# Patient Record
Sex: Female | Born: 1970 | Race: White | Hispanic: No | Marital: Married | Smoking: Never smoker
Health system: Southern US, Community
[De-identification: ages and names within clinical notes are randomized; demographics above are authoritative.]

## PROBLEM LIST (undated history)

## (undated) HISTORY — PX: TONSILLECTOMY: SUR1361

---

## 2009-06-12 HISTORY — PX: REDUCTION MAMMAPLASTY: SUR839

## 2017-12-06 ENCOUNTER — Other Ambulatory Visit: Payer: Self-pay | Admitting: Family Medicine

## 2017-12-06 DIAGNOSIS — Z1231 Encounter for screening mammogram for malignant neoplasm of breast: Secondary | ICD-10-CM

## 2018-01-09 ENCOUNTER — Other Ambulatory Visit: Payer: Self-pay | Admitting: Family Medicine

## 2018-01-09 DIAGNOSIS — Z1231 Encounter for screening mammogram for malignant neoplasm of breast: Secondary | ICD-10-CM

## 2018-01-25 ENCOUNTER — Ambulatory Visit
Admission: RE | Admit: 2018-01-25 | Discharge: 2018-01-25 | Disposition: A | Discharge status: Home / Self Care | Payer: BC Managed Care – PPO | Source: Ambulatory Visit | Attending: Family Medicine | Admitting: Family Medicine

## 2018-01-25 DIAGNOSIS — Z1231 Encounter for screening mammogram for malignant neoplasm of breast: Secondary | ICD-10-CM | POA: Diagnosis present

## 2018-02-04 ENCOUNTER — Other Ambulatory Visit: Payer: Self-pay | Admitting: *Deleted

## 2018-02-04 ENCOUNTER — Inpatient Hospital Stay
Admission: RE | Admit: 2018-02-04 | Discharge: 2018-02-04 | Disposition: A | Discharge status: Home / Self Care | Payer: Self-pay | Source: Ambulatory Visit | Attending: *Deleted | Admitting: *Deleted

## 2018-02-04 DIAGNOSIS — Z9289 Personal history of other medical treatment: Secondary | ICD-10-CM

## 2018-09-04 ENCOUNTER — Ambulatory Visit: Payer: BC Managed Care – PPO | Admitting: Allergy & Immunology

## 2018-09-04 ENCOUNTER — Encounter: Payer: Self-pay | Admitting: Allergy & Immunology

## 2018-09-04 ENCOUNTER — Other Ambulatory Visit: Payer: Self-pay

## 2018-09-04 VITALS — BP 130/80 | HR 78 | Temp 99.1°F | Ht 60.59 in | Wt 188.0 lb

## 2018-09-04 DIAGNOSIS — J310 Chronic rhinitis: Secondary | ICD-10-CM | POA: Diagnosis not present

## 2018-09-04 DIAGNOSIS — L508 Other urticaria: Secondary | ICD-10-CM | POA: Diagnosis not present

## 2018-09-04 NOTE — Patient Instructions (Addendum)
1. Chronic rhinitis - Testing today showed: red cedar (which is the only tree that blooms in the winter) and cat - Copy of test results provided.  - We are going to confirm this with blood testing as well.  - Avoidance measures provided. - Continue with: Zyrtec (cetirizine) 37m tablet twice daily - Start taking: Nasacort (triamcinolone) one spray per nostril daily as needed. - You can use an extra dose of the antihistamine, if needed, for breakthrough symptoms.  - Consider nasal saline rinses 1-2 times daily to remove allergens from the nasal cavities as well as help with mucous clearance (this is especially helpful to do before the nasal sprays are given)  2. Chronic urticaria - Your history does not have any "red flags" such as fevers, joint pains, or permanent skin changes that would be concerning for a more serious cause of hives.  - We will get some labs to rule out serious causes of hives: complete blood count, tryptase level, chronic urticaria panel, alpha gal panel, CMP, ESR, and CRP. - Chronic hives are often times a self limited process and will "burn themselves out" over 6-12 months, although this is not always the case.  - In the meantime, start suppressive dosing of antihistamines:   - Morning: Zyrtec (cetirizine) 10-2110m(one to two tablets) + Pepcid (famotidine) 2076m - Evening: Zyrtec (cetirizine) 10-86m76mne to two tablets) + Pepcid (famotidine) 86mg74m If the above is not working, try adding: Pepcid (famotidine) 86mg 67mou can change this dosing at home, decreasing the dose as needed or increasing the dosing as needed.   - If you are not tolerating the medications or are tired of taking them every day, we can start treatment with a monthly injectable medication called Xolair.   3. Return in about 6 weeks (around 10/16/2018). E-visit could work!    Please inform us of [facility] visits, hospitalizations, or changes in symptoms. Call us befKoreae going to the ED  for breathing or allergy symptoms since we might be able to fit you in for a sick visit. Feel free to contact us anyKoreame with any questions, problems, or concerns.  It was a pleasure to meet you today!  Websites that have reliable patient information: 1. American Academy of Asthma, Allergy, and Immunology: www.aaaai.org 2. Food Allergy Research and Education (FARE): foodallergy.org 3. Mothers of Asthmatics: http://www.asthmacommunitynetwork.org 4. American College of Allergy, Asthma, and Immunology: www.acaai.org  "Like" us on [place] and Instagram for our latest updates!      Make sure you are registered to vote! If you have moved or changed any of your contact information, you will need to get this updated before voting!    Voter ID laws are NOT going into effect for the General Election in November 2020! DO NOT let this stop you from exercising your right to vote!       Reducing Pollen Exposure  The American Academy of Allergy, Asthma and Immunology suggests the following steps to reduce your exposure to pollen during allergy seasons.    1. Do not hang sheets or clothing out to dry; pollen may collect on these items. 2. Do not mow lawns or spend time around freshly cut grass; mowing stirs up pollen. 3. Keep windows closed at night.  Keep car windows closed while driving. 4. Minimize morning activities outdoors, a time when pollen counts are usually at their highest. 5. Stay indoors as much as possible when pollen counts or humidity is  high and on windy days when pollen tends to remain in the air longer. 6. Use air conditioning when possible.  Many air conditioners have filters that trap the pollen spores. 7. Use a HEPA room air filter to remove pollen form the indoor air you breathe.  Control of Dog or Cat Allergen  Avoidance is the best way to manage a dog or cat allergy. If you have a dog or cat and are allergic to dog or cats, consider removing the dog or cat from the  home. If you have a dog or cat but don't want to find it a new home, or if your family wants a pet even though someone in the household is allergic, here are some strategies that may help keep symptoms at bay:  1. Keep the pet out of your bedroom and restrict it to only a few rooms. Be advised that keeping the dog or cat in only one room will not limit the allergens to that room. 2. Don't pet, hug or kiss the dog or cat; if you do, wash your hands with soap and water. 3. High-efficiency particulate air (HEPA) cleaners run continuously in a bedroom or living room can reduce allergen levels over time. 4. Regular use of a high-efficiency vacuum cleaner or a central vacuum can reduce allergen levels. 5. Giving your dog or cat a bath at least once a week can reduce airborne allergen.

## 2018-09-04 NOTE — Progress Notes (Signed)
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  Symptoms have been ongoing for approximately 2 months at this point.  She has no concerning symptoms aside from the urticaria, such as fever or joint pain, but we will do a somewhat extensive lab work-up just to rule out more serious causes.  Testing today did reveal cat as well as red cedar.  She denies any cat exposure, although there are studies that show that this is ubiquitous in everybody's environment.  However, interestingly, red cedar was starting to bloom around the same time that her hives started.  Therefore, this certainly could have been the trigger.  Plan/Recommendations:   1. Chronic rhinitis - Testing today showed: red cedar (which is the only tree that blooms in the winter) and cat - Copy of test results provided.  - We are going to confirm this with blood testing as well.  - Avoidance measures provided. - Continue with: Zyrtec (cetirizine) 48m tablet twice daily - Start taking: Nasacort (triamcinolone) one spray per nostril daily as needed. - You can use an extra dose of the antihistamine, if needed, for breakthrough symptoms.  - Consider nasal saline rinses 1-2 times daily to remove allergens from the nasal cavities as well as help with mucous clearance (this is especially helpful to do before the nasal sprays are given)  2. Chronic urticaria - Your history does not have any "red flags" such as fevers, joint pains, or permanent skin changes that would be concerning for a more serious cause of hives.  - We will get some labs to rule out serious causes of hives: complete blood count, tryptase level, chronic urticaria panel, alpha gal panel, CMP, ESR, and CRP. - Chronic hives are often times a self limited process  and will "burn themselves out" over 6-12 months, although this is not always the case.  - In the meantime, start suppressive dosing of antihistamines:   - Morning: Zyrtec (cetirizine) 10-29m(one to two tablets) + Pepcid (famotidine) 2079m - Evening: Zyrtec (cetirizine) 10-98m86mne to two tablets) + Pepcid (famotidine) 98mg81m If the above is not working, try adding: Pepcid (famotidine) 98mg 54mou can change this dosing at home, decreasing the dose as needed or increasing the dosing as needed.   - If you are not tolerating the medications or are tired of taking them every day, we can start treatment with a monthly injectable medication called Xolair.   3. Return in about 6 weeks (around 10/16/2018). E-visit could work!   Subjective:   Jacqueline Steinhardt47 y.o1female presenting today for evaluation of  Chief Complaint  Patient presents with  . Rash    shoulder and chin    Jacqueline Porter history of the following: There are no active problems to display for this patient.   History obtained from: chart review and patient.  Jacqueline CoileSharla Kidneyeferred by HedricMaryland Pink    SharonMikesha47 y.o19female presenting for an evaluation of urticaria. These have been ongoing for a period of two months.  These hives started on [employer].  They first started on her shoulder and then spread to her face.  She went to urgent care a  total of 3 separate occasions for treatment.  In the urgent care, she was given cetirizine and famotidine plus or minus prednisone.  The prednisone seemed to clear things up pretty quickly.  She never had any anaphylactic reactions with these.  They were extremely pruritic.  They did not leave any residual scarring, aside from the scarring induced from the scratching.  She had no fevers with these or viral URI symptoms.  As long as she takes cetirizine and famotidine twice daily, she does well without  any breakthrough symptoms.  She does not have an EpiPen.  She does not think that it correlates with any particular food.  She tolerates all the major food allergens without adverse event.  She does eat red meat several times a week.  She denies any recent tick bites.  She does have a history of seasonal allergies.  Spring seems to be the worst time of the year.  She will just use an over-the-counter antihistamine as needed.  She does have dogs at home, which are not new to her.  She did receive an inhaler during an episode of bronchitis several years ago.  Otherwise, she has never needed an inhaler.  She denies any nighttime coughing or wheezing.  Otherwise, there is no history of other atopic diseases, including asthma, drug allergies, stinging insect allergies, eczema or contact dermatitis. There is no significant infectious history. Vaccinations are up to date.    Past Medical History: There are no active problems to display for this patient.   Medication List:  Allergies as of 09/04/2018   Not on File     Medication List       Accurate as of September 04, 2018 12:16 PM. Always use your most recent med list.        amphetamine-dextroamphetamine 20 MG tablet Commonly known as:  ADDERALL Take 20 mg by mouth daily.   APPLE CIDER VINEGAR PO Take by mouth.   cetirizine 10 MG tablet Commonly known as:  ZYRTEC Take 10 mg by mouth daily.   citalopram 10 MG tablet Commonly known as:  CELEXA Take 10 mg by mouth daily.   famotidine 20 MG tablet Commonly known as:  PEPCID Take 20 mg by mouth 2 (two) times daily.       Birth History: non-contributory  Developmental History: non-contributory  Past Surgical History: Past Surgical History:  Procedure Laterality Date  . REDUCTION MAMMAPLASTY Bilateral 2011  . TONSILLECTOMY       Family History: Family History  Problem Relation Age of Onset  . Allergic rhinitis Neg Hx   . Asthma Neg Hx   . Eczema Neg Hx   . Urticaria  Neg Hx      Social History: Jacqueline Porter lives at home with her husband and daughter.  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  They have 2 dogs in the home.  There is no smoking exposure.  She does not have dust mite covers on her bedding.  She is from the [place] area, but moved up here when she married her current husband.   Review of Systems  Constitutional: Negative.  Negative for chills, fever, malaise/fatigue and weight loss.  HENT: Negative.  Negative for congestion, ear discharge, ear pain and sore throat.   Eyes: Negative for pain, discharge and redness.  Respiratory: Negative for cough, sputum production, shortness of breath and wheezing.   Cardiovascular: Negative.  Negative for chest pain and palpitations.  Gastrointestinal: Negative for abdominal pain, heartburn, nausea and  vomiting.  Skin: Positive for itching and rash.  Neurological: Negative for dizziness and headaches.  Endo/Heme/Allergies: Positive for environmental allergies. Does not bruise/bleed easily.       Objective:   Blood pressure 130/80, pulse 78, temperature 99.1 F (37.3 C), temperature source Oral, height 5' 0.59" (1.539 m), weight 188 lb (85.3 kg), SpO2 98 %. Body mass index is 36 kg/m.   Physical Exam:   Physical Exam  Constitutional: She appears well-developed and well-nourished.  Pleasant female.  HENT:  Head: Normocephalic and atraumatic.  Right Ear: Tympanic membrane, external ear and ear canal normal. No drainage, swelling or tenderness. Tympanic membrane is not injected, not scarred, not erythematous, not retracted and not bulging.  Left Ear: Tympanic membrane, external ear and ear canal normal. No drainage, swelling or tenderness. Tympanic membrane is not injected, not scarred, not erythematous, not retracted and not bulging.  Nose: Mucosal edema present. No rhinorrhea, nasal deformity or septal deviation. No epistaxis. Right sinus exhibits no maxillary sinus tenderness and no  frontal sinus tenderness. Left sinus exhibits no maxillary sinus tenderness and no frontal sinus tenderness.  Mouth/Throat: Uvula is midline and oropharynx is clear and moist. Mucous membranes are not pale and not dry.  No cobblestoning present in the posterior oropharynx.  Eyes: Pupils are equal, round, and reactive to light. Conjunctivae and EOM are normal. Right eye exhibits no chemosis and no discharge. Left eye exhibits no chemosis and no discharge. Right conjunctiva is not injected. Left conjunctiva is not injected.  Cardiovascular: Normal rate, regular rhythm and normal heart sounds.  Respiratory: Effort normal and breath sounds normal. No accessory muscle usage. No tachypnea. No respiratory distress. She has no wheezes. She has no rhonchi. She has no rales. She exhibits no tenderness.  Moving air well in all lung fields.  No crackles or wheezes.  GI: Bowel sounds are normal. There is no abdominal tenderness. There is no rebound and no guarding.  Lymphadenopathy:       Head (right side): No submandibular, no tonsillar and no occipital adenopathy present.       Head (left side): No submandibular, no tonsillar and no occipital adenopathy present.    She has no cervical adenopathy.  Neurological: She is alert.  Skin: No abrasion, no petechiae and no rash noted. Rash is not papular, not vesicular and not urticarial. No erythema. No pallor.  She does have some residual scarring on the right arm secondary to where she scratched.  Otherwise, there are no urticarial lesions appreciated.  There are no eczematous lesions appreciated.  Psychiatric: She has a normal mood and affect.     Diagnostic studies:   Allergy Studies:    Airborne Adult Perc - 09/04/18 1022    Time Antigen Placed  1022    Allergen Manufacturer  Greer    Location  Back    Number of Test  59    Panel 1  Select    1. Control-Buffer 50% Glycerol  Negative    2. Control-Histamine 1 mg/ml  2+    3. Albumin saline  Negative     4. [place]  Negative    5. [place]  Negative    6. Johnson  Negative    7. Lima Blue  Negative    8. Meadow Fescue  Negative    9. Perennial Rye  Negative    10. Sweet Vernal  Negative    11. Timothy  Negative    12. Cocklebur  Negative    13. Burweed  Marshelder  Negative    14. Ragweed, short  Negative    15. Ragweed, Giant  Negative    16. Plantain,  English  Negative    17. Lamb's Quarters  Negative    18. Sheep Sorrell  Negative    19. Rough Pigweed  Negative    20. Marsh Elder, Rough  Negative    21. Mugwort, Common  Negative    22. Ash mix  Negative    23. Birch mix  Negative    24. Beech American  Negative    25. Box, Elder  Negative    26. Cedar, red  2+    27. Cottonwood, [place]  Negative    28. Elm mix  Negative    29. Hickory mix  Negative    30. Maple mix  Negative    31. Oak, [place] mix  Negative    32. Pecan Pollen  Negative    33. Pine mix  Negative    34. Sycamore Eastern  Negative    35. Ple[place], Black Pollen  Negative    36. Alternaria alternata  Negative    37. Cladosporium Herbarum  Negative    38. Aspergillus mix  Negative    39. Penicillium mix  Negative    40. Bipolaris sorokiniana (Helminthosporium)  Negative    41. Drechslera spicifera (Curvularia)  Negative    42. Mucor plumbeus  Negative    43. Fusarium moniliforme  Negative    44. Aureobasidium pullulans (pullulara)  Negative    45. Rhizopus oryzae  Negative    46. Botrytis cinera  Negative    47. Epicoccum nigrum  Negative    48. Phoma betae  Negative    49. Candida Albicans  Negative    50. Trichophyton mentagrophytes  Negative    51. Mite, D Farinae  5,000 AU/ml  Negative    52. Mite, D Pteronyssinus  5,000 AU/ml  Negative    53. Cat Hair 10,000 BAU/ml  3+    54.  Dog Epithelia  Negative    55. Mixed Feathers  Negative    56. Horse Epithelia  Negative    57. Cockroach, German  Negative    58. Mouse  Negative    59. Tobacco Leaf  Negative     Food Adult Perc - 09/04/18 1000     Time Antigen Placed  1022    Allergen Manufacturer  Greer    Location  Back    Number of allergen test  10    1. Peanut  Negative    2. Soybean  Negative    3. Wheat  Negative    4. Sesame  Negative    5. Milk, cow  Negative    6. Egg White, Chicken  Negative    7. Casein  Negative    8. Shellfish Mix  Negative    9. Fish Mix  Negative    10. Cashew  Negative       Allergy testing results were read and interpreted by myself, documented by clinical staff.         Salvatore Marvel, MD [facility]

## 2018-09-10 LAB — CMP14+EGFR
ALT: 16 IU/L (ref 0–32)
AST: 24 IU/L (ref 0–40)
Albumin/Globulin Ratio: 1.9 (ref 1.2–2.2)
Albumin: 4.3 g/dL (ref 3.8–4.8)
Alkaline Phosphatase: 103 IU/L (ref 39–117)
BUN/Creatinine Ratio: 10 (ref 9–23)
BUN: 9 mg/dL (ref 6–24)
Bilirubin Total: 0.8 mg/dL (ref 0.0–1.2)
CO2: 25 mmol/L (ref 20–29)
Calcium: 8.8 mg/dL (ref 8.7–10.2)
Chloride: 99 mmol/L (ref 96–106)
Creatinine, Ser: 0.93 mg/dL (ref 0.57–1.00)
GFR calc Af Amer: 84 mL/min/{1.73_m2} (ref >59)
GFR calc non Af Amer: 73 mL/min/{1.73_m2} (ref >59)
Globulin, Total: 2.3 g/dL (ref 1.5–4.5)
Glucose: 82 mg/dL (ref 65–99)
Potassium: 4.3 mmol/L (ref 3.5–5.2)
Sodium: 141 mmol/L (ref 134–144)
Total Protein: 6.6 g/dL (ref 6.0–8.5)

## 2018-09-10 LAB — IGE+ALLERGENS ZONE 2(30)
Alternaria Alternata IgE: <0.1 kU/L
Amer Sycamore IgE Qn: <0.1 kU/L
Aspergillus Fumigatus IgE: <0.1 kU/L
Bahia Grass IgE: <0.1 kU/L
Bermuda Grass IgE: <0.1 kU/L
Cat Dander IgE: 1.34 kU/L — AB
Cedar, Mountain IgE: 2.04 kU/L — AB
Cladosporium Herbarum IgE: <0.1 kU/L
Cockroach, American IgE: <0.1 kU/L
Common Silver Birch IgE: <0.1 kU/L
D Farinae IgE: 0.13 kU/L — AB
D Pteronyssinus IgE: <0.1 kU/L
Dog Dander IgE: 0.13 kU/L — AB
Elm, American IgE: <0.1 kU/L
Hickory, White IgE: <0.1 kU/L
IgE (Immunoglobulin E), Serum: 33 IU/mL (ref 6–495)
Johnson Grass IgE: <0.1 kU/L
Maple/Box Elder IgE: <0.1 kU/L
Mucor Racemosus IgE: <0.1 kU/L
Mugwort IgE Qn: <0.1 kU/L
Nettle IgE: <0.1 kU/L
Oak, White IgE: <0.1 kU/L
Penicillium Chrysogen IgE: <0.1 kU/L
Pigweed, Rough IgE: <0.1 kU/L
Plantain, English IgE: <0.1 kU/L
Ragweed, Short IgE: <0.1 kU/L
Sheep Sorrel IgE Qn: <0.1 kU/L
Stemphylium Herbarum IgE: <0.1 kU/L
Sweet gum IgE RAST Ql: <0.1 kU/L
Timothy Grass IgE: <0.1 kU/L
White Mulberry IgE: <0.1 kU/L

## 2018-09-10 LAB — CBC WITH DIFFERENTIAL/PLATELET
Basophils Absolute: 0.1 10*3/uL (ref 0.0–0.2)
Basos: 1 %
EOS (ABSOLUTE): 0.6 10*3/uL — ABNORMAL HIGH (ref 0.0–0.4)
Eos: 8 %
Hematocrit: 39.3 % (ref 34.0–46.6)
Hemoglobin: 13.6 g/dL (ref 11.1–15.9)
Immature Grans (Abs): 0 10*3/uL (ref 0.0–0.1)
Immature Granulocytes: 0 %
Lymphocytes Absolute: 1.5 10*3/uL (ref 0.7–3.1)
Lymphs: 21 %
MCH: 30.7 pg (ref 26.6–33.0)
MCHC: 34.6 g/dL (ref 31.5–35.7)
MCV: 89 fL (ref 79–97)
Monocytes Absolute: 0.4 10*3/uL (ref 0.1–0.9)
Monocytes: 6 %
Neutrophils Absolute: 4.4 10*3/uL (ref 1.4–7.0)
Neutrophils: 64 %
Platelets: 344 10*3/uL (ref 150–450)
RBC: 4.43 x10E6/uL (ref 3.77–5.28)
RDW: 13.1 % (ref 11.7–15.4)
WBC: 7 10*3/uL (ref 3.4–10.8)

## 2018-09-10 LAB — ANA W/REFLEX IF POSITIVE: Anti Nuclear Antibody (ANA): NEGATIVE

## 2018-09-10 LAB — SEDIMENTATION RATE: Sed Rate: 23 mm/hr (ref 0–32)

## 2018-09-10 LAB — ALPHA-GAL PANEL
Alpha Gal IgE*: <0.1 kU/L (ref <0.10)
Beef (Bos spp) IgE: <0.1 kU/L (ref <0.35)
Class Interpretation: 0
Class Interpretation: 0
Class Interpretation: 0
Lamb/Mutton (Ovis spp) IgE: <0.1 kU/L (ref <0.35)
Pork (Sus spp) IgE: <0.1 kU/L (ref <0.35)

## 2018-09-10 LAB — TRYPTASE: Tryptase: 3.4 ug/L (ref 2.2–13.2)

## 2018-09-10 LAB — C-REACTIVE PROTEIN: CRP: 12 mg/L — ABNORMAL HIGH (ref 0–10)

## 2018-09-10 LAB — CHRONIC URTICARIA: cu index: 4.2 (ref <10)

## 2018-10-17 ENCOUNTER — Encounter: Payer: Self-pay | Admitting: Allergy & Immunology

## 2018-10-17 ENCOUNTER — Ambulatory Visit (INDEPENDENT_AMBULATORY_CARE_PROVIDER_SITE_OTHER): Payer: BC Managed Care – PPO | Admitting: Allergy & Immunology

## 2018-10-17 ENCOUNTER — Other Ambulatory Visit: Payer: Self-pay

## 2018-10-17 DIAGNOSIS — J3089 Other allergic rhinitis: Secondary | ICD-10-CM

## 2018-10-17 DIAGNOSIS — J302 Other seasonal allergic rhinitis: Secondary | ICD-10-CM

## 2018-10-17 DIAGNOSIS — L508 Other urticaria: Secondary | ICD-10-CM | POA: Diagnosis not present

## 2018-10-17 MED ORDER — TRIAMCINOLONE ACETONIDE 55 MCG/ACT NA AERO: 2.0000 | INHALATION_SPRAY | Freq: Every day | NASAL | 5 refills | Status: AC

## 2018-10-17 NOTE — Progress Notes (Signed)
RE: Lamount CrankerSharon Michelle Locklear Porter MRN: [id number] DOB: 1970-09-07 Date of Telemedicine Visit: 10/17/2018  Referring provider: Jerl MinaHedrick, James, MD Primary care provider: Jerl MinaHedrick, James, MD  Chief Complaint: Follow-up   Telemedicine Follow Up Visit via WebEx: I connected with Jacqueline Porter for a follow up on 10/17/18 by WebEx and verified that I am speaking with the correct person using two identifiers.   I discussed the limitations, risks, security and privacy concerns of performing an evaluation and management service by telemedicine and the availability of in person appointments. I also discussed with the patient that there may be a patient responsible charge related to this service. The patient expressed understanding and agreed to proceed.  Patient is at home accompanied by herself who provided/contributed to the history.  Provider is at the office.  Visit start time: 1:38 PM Visit end time: 2:54 PM Insurance consent/check in by: JC Medical consent and medical assistant/nurse: Lachelle  History of Present Illness:  She is a 48 y.o. female, who is being followed for chronic idiopathic urticaria. Her previous allergy office visit was in March 2020 with Dr. Dellis AnesGallagher.  At that visit, we did do testing that demonstrated positives to red cedar and cat.  We continued her on Zyrtec 10 mg twice daily and started Nasacort 1 spray per nostril daily as needed.  For her chronic urticaria, we did get some labs to rule out serious causes of hives.  We started her on Zyrtec 10-20 mg plus famotidine 20 mg twice daily.   Her extensive lab work-up was fairly normal.  Inflammatory markers, ANA, tryptase, chronic urticaria panel, and alpha gal were all normal.  She did have a CBC that showed elevated eosinophils (absolute eosinophil count 600) which I felt were related to uncontrolled allergies.  An environmental allergy panel was positive to dust mites and dog in addition to cat and cedar.  Since  the last visit, she has done well. She is taking one cetirizine twice daily. She is taking Pepcid twice daily as well. As long as she takes her medications as prescribed, her hives stay at bay. She does not have any systemic symptoms and she has never needed an epinephrine autoinjector. She still is unsure of the triggers but with her being able to control her symptoms with the medications, it is certainly less of a problem overall.  She has not required prednisone since last visit.  She is using the Nasacort as needed. She uses this in particular when she goes outdoors.  Overall, her symptoms are well controlled with the current medication regimen.  She has not required antibiotics since last visit.  Otherwise, there have been no changes to her past medical history, surgical history, family history, or social history.  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  She has a few more weeks left.  Evidently, there are plans to start school early in the fall.  Assessment and Plan:  Jacqueline Porter is a 48 y.o. female with:  Perennial and seasonal allergic rhinitis (cat, dog, dust mite, red cedar)  Chronic idiopathic urticaria   1. Chronic rhinitis (cat, dog, dust mite, red cedar) - Continue with: Zyrtec (cetirizine) 10mg  tablet twice daily + Nasacort (triamcinolone) one spray per nostril daily as needed - Consider nasal saline rinses 1-2 times daily to remove allergens from the nasal cavities as well as help with mucous clearance (this is especially helpful to do before the nasal sprays are given)  2. Chronic urticaria - with largely normal lab workup - Continue with suppressive  dosing of antihistamines:   - Morning: Zyrtec (cetirizine) 10-20mg  (one to two tablets) + Pepcid (famotidine) 20mg    - Evening: Zyrtec (cetirizine) 10-20mg  (one to two tablets) + Pepcid (famotidine) 20mg    - You can change this dosing at home, decreasing the dose as needed or increasing the dosing as needed.   - I did recommend trying to stop the  Pepcid since this is probably the most expensive medication of the regimen.  - If you are not tolerating the medications or are tired of taking them every day, we can start treatment with a monthly injectable medication called Xolair.   3. Follow up in one year or earlier if needed.   Diagnostics: None.  Medication List:  Current Outpatient Medications  Medication Sig Dispense Refill  . amphetamine-dextroamphetamine (ADDERALL) 20 MG tablet Take 20 mg by mouth daily.    . APPLE CIDER VINEGAR PO Take by mouth.    . cetirizine (ZYRTEC) 10 MG tablet Take 10 mg by mouth daily.    . citalopram (CELEXA) 10 MG tablet Take 10 mg by mouth daily.    Marland Kitchen estradiol (ESTRACE) 1 MG tablet     . famotidine (PEPCID) 20 MG tablet Take 20 mg by mouth 2 (two) times daily.     No current facility-administered medications for this visit.    Allergies: Not on File I reviewed her past medical history, social history, family history, and environmental history and no significant changes have been reported from previous visits.  Review of Systems  Constitutional: Negative for activity change and appetite change.  HENT: Negative for congestion, postnasal drip, rhinorrhea, sinus pressure and sore throat.   Eyes: Negative for pain, discharge, redness and itching.  Respiratory: Negative for shortness of breath, wheezing and stridor.   Gastrointestinal: Negative for diarrhea, nausea and vomiting.  Musculoskeletal: Negative for arthralgias, joint swelling and myalgias.  Skin: Negative for rash.  Allergic/Immunologic: Negative for environmental allergies and food allergies.    Objective:  Physical exam not obtained as encounter was done via telephone.   Previous notes and tests were reviewed.  I discussed the assessment and treatment plan with the patient. The patient was provided an opportunity to ask questions and all were answered. The patient agreed with the plan and demonstrated an understanding of the  instructions.   The patient was advised to call back or seek an in-person evaluation if the symptoms worsen or if the condition fails to improve as anticipated.  I provided 16 minutes of non-face-to-face time during this encounter.  It was my pleasure to participate in Jimmye Mcgilberry Paradise's care today. Please feel free to contact me with any questions or concerns.   Sincerely,  Alfonse Spruce, MD

## 2018-10-17 NOTE — Patient Instructions (Signed)
1. Chronic rhinitis (cat, dog, dust mite, red cedar) - Continue with: Zyrtec (cetirizine) 10mg  tablet twice daily + Nasacort (triamcinolone) one spray per nostril daily as needed - Consider nasal saline rinses 1-2 times daily to remove allergens from the nasal cavities as well as help with mucous clearance (this is especially helpful to do before the nasal sprays are given)  2. Chronic urticaria - with largely normal lab workup - Continue with suppressive dosing of antihistamines:   - Morning: Zyrtec (cetirizine) 10-20mg  (one to two tablets) + Pepcid (famotidine) 20mg    - Evening: Zyrtec (cetirizine) 10-20mg  (one to two tablets) + Pepcid (famotidine) 20mg    - You can change this dosing at home, decreasing the dose as needed or increasing the dosing as needed.   - If you are not tolerating the medications or are tired of taking them every day, we can start treatment with a monthly injectable medication called Xolair.   3. No follow-ups on file. E-visit could work!    Please inform us of any Emergency Department visits, hospitalizations, or changes in symptoms. Call us before going to the ED for breathing or allergy symptoms since we might be able to fit you in for a sick visit. Feel free to contact us anytime with any questions, problems, or concerns.  It was a pleasure to meet you today!  Websites that have reliable patient information: 1. American Academy of Asthma, Allergy, and Immunology: www.aaaai.org 2. Food Allergy Research and Education (FARE): foodallergy.org 3. Mothers of Asthmatics: http://www.asthmacommunitynetwork.org 4. American College of Allergy, Asthma, and Immunology: www.acaai.org  Like us on [employer] and Instagram for our latest updates!      Make sure you are registered to vote! If you have moved or changed any of your contact information, you will need to get this updated before voting!    Voter ID laws are NOT going into effect for the General Election in  November 2020! DO NOT let this stop you from exercising your right to vote!       Reducing Pollen Exposure  The American Academy of Allergy, Asthma and Immunology suggests the following steps to reduce your exposure to pollen during allergy seasons.    1. Do not hang sheets or clothing out to dry; pollen may collect on these items. 2. Do not mow lawns or spend time around freshly cut grass; mowing stirs up pollen. 3. Keep windows closed at night.  Keep car windows closed while driving. 4. Minimize morning activities outdoors, a time when pollen counts are usually at their highest. 5. Stay indoors as much as possible when pollen counts or humidity is high and on windy days when pollen tends to remain in the air longer. 6. Use air conditioning when possible.  Many air conditioners have filters that trap the pollen spores. 7. Use a HEPA room air filter to remove pollen form the indoor air you breathe.  Control of Dog or Cat Allergen  Avoidance is the best way to manage a dog or cat allergy. If you have a dog or cat and are allergic to dog or cats, consider removing the dog or cat from the home. If you have a dog or cat but dont want to find it a new home, or if your family wants a pet even though someone in the household is allergic, here are some strategies that may help keep symptoms at bay:  1. Keep the pet out of your bedroom and restrict it to only a few rooms. Be  advised that keeping the dog or cat in only one room will not limit the allergens to that room. 2. Dont pet, hug or kiss the dog or cat; if you do, wash your hands with soap and water. 3. High-efficiency particulate air (HEPA) cleaners run continuously in a bedroom or living room can reduce allergen levels over time. 4. Regular use of a high-efficiency vacuum cleaner or a central vacuum can reduce allergen levels. 5. Giving your dog or cat a bath at least once a week can reduce airborne allergen.

## 2018-10-18 ENCOUNTER — Ambulatory Visit: Payer: BC Managed Care – PPO | Admitting: Allergy & Immunology

## 2020-05-18 ENCOUNTER — Other Ambulatory Visit: Payer: Self-pay

## 2020-05-18 ENCOUNTER — Ambulatory Visit (INDEPENDENT_AMBULATORY_CARE_PROVIDER_SITE_OTHER): Payer: 59 | Admitting: Psychiatry

## 2020-05-18 DIAGNOSIS — F411 Generalized anxiety disorder: Secondary | ICD-10-CM | POA: Diagnosis not present

## 2020-05-18 NOTE — Progress Notes (Signed)
[facility] Counselor Initial Adult Exam  Name: Jacqueline Porter Date: 05/18/2020 MRN: [id number] DOB: 1971/04/18 PCP: Jerl Mina, MD  Time spent: 60 minutes  4:00pm to 5:00pm  Guardian/Payee:  patient  Paperwork requested:  No   Reason for Visit /Presenting Problem: anxiety, marital stress "my fault" (may be on edge of divorce), has struggled with ADD all my life and am on meds.; been married 3 times and current marriage has lasted 5 yrs and been together 10 yrs,  Has 52 yrs old daughter by another man and daughter treats mom's current husband as a dad.  Daughter is aware there's marital issues but not the details. Negative/fearful/anxious thoughts and self-talk. Is concerned about her daughter.  Mental Status Exam:   Appearance:   Casual     Behavior:  Appropriate, Sharing and Motivated  Motor:  Normal  Speech/Language:   Clear and Coherent  Affect:  anxious  Mood:  anxious  Thought process:  normal  Thought content:    WNL  Sensory/Perceptual   WNL  Orientation:  WNL  Attention:  WNL  Concentration:  Fair  Memory:  WNL  Fund of knowledge:   Good  Insight:    Good and Fair  Judgment:   Fair  Impulse Control:  Fair   Reported Symptoms:  See symptoms above.  Risk Assessment: Danger to Self:  No Self-injurious Behavior: No Danger to Others: No Duty to Warn:no Physical Aggression / Violence:No  Access to Firearms a concern: No  Gang Involvement:No  Patient / guardian was educated about steps to take if suicide or homicide risk level increases between visits: Patient denies any SI or HI. While future psychiatric events cannot be accurately predicted, the patient does not currently require acute inpatient psychiatric care and does not currently meet [facility] involuntary commitment criteria.  Substance Abuse History: Current substance abuse: No     Past Psychiatric History:   Previous psychological history is significant for anxiety and parents divorced when I was  young, and had counseling in college t [place]. Outpatient Providers:names not given History of Psych Hospitalization: No  Psychological Testing: n/a   Abuse History: Victim of No., n/a   Report needed: No. Victim of Neglect:No. Perpetrator of n/a  Witness / Exposure to Domestic Violence: No   Protective Services Involvement: No  Witness to [employer] Violence:  No   Family History:  Patient confirms info below.  Family History  Problem Relation Age of Onset  . Allergic rhinitis Neg Hx   . Asthma Neg Hx   . Eczema Neg Hx   . Urticaria Neg Hx     Living situation: the patient lives with their spouse, her 68 yr old daughter, and husband's 2 yr old son.  She also has a 18yr old son in college in [place], [place] (automotive studies).  Sexual Orientation:  Straight  Relationship Status: married 5 yrs (been together 10 yrs), prior marriages lasted 7 yrs and 6 yrs.  Name of spouse / other: n/a             If a parent, number of children / ages: 11 yr old girl and 18 yrs old boy by different fathers.  Patient grew up in [place], [place].  Parents divorced when she was 8.  My dad was a Public relations account executive".  Support Systems; friends, spouse supportive sometimes, mother is supportive. Dad is deceased.  Financial Stress:  No   Income/Employment/Disability: Employment  Financial planner: No   Educational History: Education: Engineer, maintenance (IT)  Religion/Sprituality/World View:   Protestant  Any cultural differences that may affect / interfere with treatment:  not applicable   Recreation/Hobbies: music, reading   Stressors:Marital or family conflict  Strengths:  Supportive Relationships, Friends, Church and Hopefulness  Barriers:  "not sure"  Legal History: Pending legal issue / charges: The patient has no significant history of legal issues. History of legal issue / charges: n/a  Medical History/Surgical History:no past surgical history in file.  No past medical history on file.  Reviewed with patient and she confirms info below.  Past Surgical History:  Procedure Laterality Date  . REDUCTION MAMMAPLASTY Bilateral 2011  . TONSILLECTOMY      Medications: confirmed by patient Current Outpatient Medications  Medication Sig Dispense Refill  . amphetamine-dextroamphetamine (ADDERALL) 20 MG tablet Take 20 mg by mouth daily.    . APPLE CIDER VINEGAR PO Take by mouth.    . cetirizine (ZYRTEC) 10 MG tablet Take 10 mg by mouth daily.    . citalopram (CELEXA) 10 MG tablet Take 10 mg by mouth daily.    Marland Kitchen estradiol (ESTRACE) 1 MG tablet     . famotidine (PEPCID) 20 MG tablet Take 20 mg by mouth 2 (two) times daily.    Marland Kitchen triamcinolone (NASACORT) 55 MCG/ACT AERO nasal inhaler Place 2 sprays into the nose daily. 1 Bottle 5   No current facility-administered medications for this visit.    Not on File  Diagnoses:    ICD-10-CM   1. Generalized anxiety disorder  F41.1     Plan of Care:  Patient not signing treatment plan on computer screen due to Covid.  Treatment Goals: Treatment  Goals remain on treatment plan as patient works with strategies to achieve her goals. Progress is noted each session and documented in "Progress" section of Note.  Long term goal: Reduce overall level, frequency, and intensity of the anxiety so that daily functioning is not impaired.  Short-term goal: Verbalize verbalize an understanding of the role that anxious/fearful thinking plays in creating fears excessive worry and persistent anxiety symptoms.  Strategies: Identify, challenge, and replace anxious/fearful/negative self talk and thoughts with more positive, reality based, and empowering thoughts and self talk.  Progress: This is patient's first appointment with therapist.  Today we completed her initial evaluation and formulated her treatment goal plan.  Patient presents for treatment today due to marital issues experienced recently.  She is seeking individual counseling for her own  personal issues, and that do relate to the marriage.  She is a 49 year old female, who is in her third marriage.  Her current marriage has lasted 5 years and they were together 5 years before that and have no children together.  Patient has 2 children by previous marriages, one 46 year old son who is in college in [place], and one 50 year old girl who currently lives with patient.  The children are aware of some of the issues going on with parents, and patient's daughter actually sees patient's husband as her "father" even though they are not biologically related.  The 54 year old's real father has no contact with her.  Patient has been in therapy when she was much younger and also adds that she is "very ADD and takes Adderall".  Patient reports that "my dad was a Programmer, multimedia" and that her parents divorced when she was 39 years old.  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  Her goals in therapy are related to her anxiety,  decision making, anxious/negative self talk, her marriage and family.  We will process some of this more with her next visit as we ran past her time the session in completing her initial evaluation and treatment goal plan.  Goal review with patient and she is in agreement with established goals.  Next appointment within 2 to 3 weeks depending upon her scheduling.   Mathis Fare, LCSW

## 2020-06-14 ENCOUNTER — Ambulatory Visit (INDEPENDENT_AMBULATORY_CARE_PROVIDER_SITE_OTHER): Payer: No Typology Code available for payment source | Admitting: Psychiatry

## 2020-06-14 ENCOUNTER — Other Ambulatory Visit: Payer: Self-pay

## 2020-06-14 DIAGNOSIS — F411 Generalized anxiety disorder: Secondary | ICD-10-CM | POA: Diagnosis not present

## 2020-06-14 NOTE — Progress Notes (Signed)
[facility] Counselor/Therapist Progress Note  Patient ID: Jacqueline Porter, MRN: [id number],    Date: 06/14/2020  Time Spent: 60 minutes     3:55pm to 4:55:pm  Treatment Type: Individual Therapy  Reported Symptoms: anxiety, depression  Mental Status Exam:  Appearance:   Casual     Behavior:  Appropriate, Sharing and Motivated  Motor:  Normal  Speech/Language:   Clear and Coherent  Affect:  anxious  Mood:  anxious and depressed  Thought process:  Goal-directed  Thought content:    WNL  Sensory/Perceptual disturbances:    WNL  Orientation:  oriented to person, place, time/date, situation, day of week, month of year and year  Attention:  Fair  Concentration:  Fair  Memory:  WNL  Fund of knowledge:   Good  Insight:    Fair  Judgment:   Good  Impulse Control:  Good   Risk Assessment: Danger to Self:  No Self-injurious Behavior: No Danger to Others: No Duty to Warn:no Physical Aggression / Violence:No  Access to Firearms a concern: No  Gang Involvement:No   Subjective: Patient in today reporting anxiety and depression. Feels she has" been on a roller-coaster, sometimes I'm ok and other times I'm real anxious/nervous."   Interventions: Cognitive Behavioral Therapy, Solution-Oriented/Positive Psychology and Ego-Supportive  Diagnosis:   ICD-10-CM   1. Generalized anxiety disorder  F41.1      Plan of Care:  Patient not signing treatment plan on computer screen due to Covid.  Treatment Goals: Treatment  Goals remain on treatment plan as patient works with strategies to achieve her goals. Progress is noted each session and documented in "Progress" section of Note.  Long term goal: Reduce overall level, frequency, and intensity of the anxiety so that daily functioning is not impaired.  Short-term goal: Verbalize verbalize an understanding of the role that anxious/fearful thinking plays in creating fears excessive worry and persistent anxiety  symptoms.  Strategies: Identify, challenge, and replace anxious/fearful/negative self talk and thoughts with more positive, reality based, and empowering thoughts and self talk.  Progress:  Patient in today reporting anxiety and depression.  Some tearfulness initially saying "I was afraid the snow would keep me from coming today but glad it didn't."  Tearfully talked through her anxiety and depression regarding marital issue due to some choices she made but regrets now.  Tearfully talked through her regrets and how things have been going at home more recently which has been very volatile.  Patient wants to remain in the marriage but is not sure that is can be possible per husband.  He has not agreed to be in marital counseling nor to speak with a counselor by himself.  Focused with patient on her feelings of remorse for her actions and also current her she is experiencing in relationship, and feeling  apart from others that used to be close to them as a couple.  She is very sad but states that she does not feel totally hopeless and she denies any SI.  She needed most of the session today to process her mixed feelings as well as some anxiety and fears and depressive feelings and stated at the end of session she did feel better.  We did review some of the areas that she is seeking help with including her anxiety, depression, negative self talk, self forgiveness, and decision making.  Discussed self forgiveness some just before session and and she is to do more thinking and working with that issue between now  and next session.  Goal review and progress/challenges noted with patient.  Next appointment within 2 weeks.   Mathis Fare, LCSW

## 2020-06-28 ENCOUNTER — Ambulatory Visit: Payer: 59 | Admitting: Psychiatry

## 2020-07-05 ENCOUNTER — Ambulatory Visit (INDEPENDENT_AMBULATORY_CARE_PROVIDER_SITE_OTHER): Payer: No Typology Code available for payment source | Admitting: Psychiatry

## 2020-07-05 ENCOUNTER — Other Ambulatory Visit: Payer: Self-pay

## 2020-07-05 DIAGNOSIS — F411 Generalized anxiety disorder: Secondary | ICD-10-CM

## 2020-07-05 NOTE — Progress Notes (Signed)
      [facility] Counselor/Therapist Progress Note  Patient ID: BEVERELY SUEN, MRN: [id number],    Date: 07/05/2020  Time Spent: 60 minutes   9:00am to 10:00am  Treatment Type: Individual Therapy  Reported Symptoms: Anxiety, depression, some low motivation but "pretty good motivation today", tearfulness    Mental Status Exam:  Appearance:   Casual     Behavior:  Appropriate, Sharing and Motivated  Motor:  Normal  Speech/Language:   Clear and Coherent  Affect:  Depressed and anxious  Mood:  anxious and depressed  Thought process:  goal directed  Thought content:    WNL  Sensory/Perceptual disturbances:    WNL  Orientation:  oriented to person, place, time/date, situation, day of week, month of year and year  Attention:  Good  Concentration:  Good and Fair  Memory:  WNL  Fund of knowledge:   Good  Insight:    Good  Judgment:   Good  Impulse Control:  Good   Risk Assessment: Danger to Self:  No Self-injurious Behavior: No Danger to Others: No Duty to Warn:no Physical Aggression / Violence:No  Access to Firearms a concern: No  Gang Involvement:No   Subjective: Patient today reporting anxiety, tearful, and depression, lots of issues with spouse since some trust was broken.  Interventions: Cognitive Behavioral Therapy, Solution-Oriented/Positive Psychology and Ego-Supportive  Diagnosis:   ICD-10-CM   1. Generalized anxiety disorder  F41.1      Plan of Care: Patient not signing treatment plan on computer screen due to Covid.  Treatment Goals: Treatment Goals remain on treatment plan as patient works with strategies to achieve her goals. Progress is noted each session and documented in "Progress" section of Note.  Long term goal: Reduce overall level, frequency, and intensity of the anxiety so that daily functioning is not impaired.  Short-term goal: Verbalize verbalize an understanding of the role that anxious/fearful thinking plays in creating fears  excessive worry and persistent anxiety symptoms.  Strategies: Identify, challenge, and replace anxious/fearful/negative self talk and thoughts with more positive, reality based, and empowering thoughts and self talk.  Progress:  Patient in today reporting anxiety, depression, and tearfulness regarding personal and marital issues. Tearfully described more of her marital concerns, personal feeling very bad about herself and having difficulty with any self forgiveness.  Discussed forgiveness at length and seemed to understand a healthier definition of forgiveness, especially as it relates to forgiving self in order to move forward. Anxious/depressive thoughts frequently.  Denies any SI.  Feels that "I've always been a disappointment to other people.  Religous beliefs also impacting her at times.  Worked with her short-term goal and strategies in tx goal plan above to improve her self-talk and work further on self-forgiveness.  Patient is to continue this work in between sessions.  Continuing to focus also on strategies for managing anxiety and depression as well as her decision making. 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  Encouraged her to work hard with her goals as we discussed in session today, stay in the present and focus on what she can control or change, healthy nutrition and exercise including walking, and staying in contact with people who are supportive of her.  Goal review and progress/challenges noted with patient.  Next appointment within 2 to 3 weeks.   Mathis Fare, LCSW

## 2020-07-12 ENCOUNTER — Ambulatory Visit: Payer: 59 | Admitting: Psychiatry

## 2020-07-20 ENCOUNTER — Other Ambulatory Visit: Payer: Self-pay

## 2020-07-20 ENCOUNTER — Ambulatory Visit (INDEPENDENT_AMBULATORY_CARE_PROVIDER_SITE_OTHER): Payer: No Typology Code available for payment source | Admitting: Psychiatry

## 2020-07-20 DIAGNOSIS — F411 Generalized anxiety disorder: Secondary | ICD-10-CM

## 2020-07-20 NOTE — Progress Notes (Signed)
[facility] Counselor/Therapist Progress Note  Patient ID: Jacqueline Porter, MRN: [id number],    Date: 07/20/2020  Time Spent: 60 minutes   4:00pm to 5:00pm  Treatment Type: Individual Therapy  Reported Symptoms: anxiety, depression increased some, husband got separation papers  Mental Status Exam:  Appearance:   Well Groomed     Behavior:  Appropriate, Sharing and Motivated  Motor:  Normal  Speech/Language:   Clear and Coherent  Affect:  anxious, depressed  Mood:  anxious and depressed  Thought process:  normal  Thought content:    WNL  Sensory/Perceptual disturbances:    WNL  Orientation:  oriented to person, place, time/date, situation, day of week, month of year and year  Attention:  Good  Concentration:  Good  Memory:  WNL  Fund of knowledge:   Good  Insight:    Good  Judgment:   Good  Impulse Control:  Good   Risk Assessment: Danger to Self:  No Self-injurious Behavior: No Danger to Others: No Duty to Warn:no Physical Aggression / Violence:No  Access to Firearms a concern: No  Gang Involvement:No   Subjective: Patient today reporting anxiety, depression. Marital issues but husband won't be involved in getting help.  Interventions: Cognitive Behavioral Therapy and Solution-Oriented/Positive Psychology  Diagnosis:   ICD-10-CM   1. Generalized anxiety disorder  F41.1      Plan of Care: Patient not signing treatment plan on computer screen due to Covid.  Treatment Goals: Treatment Goals remain on treatment plan as patient works with strategies to achieve her goals. Progress is noted each session and documented in "Progress" section of Note.  Long term goal: Reduce overall level, frequency, and intensity of the anxiety so that daily functioning is not impaired.  Short-term goal: Verbalize verbalize an understanding of the role that anxious/fearful thinking plays in creating fears excessive worry and persistent anxiety  symptoms.  Strategies: Identify, challenge, and replace anxious/fearful/negative self talk and thoughts with more positive, reality based, and empowering thoughts and self talk.  Progress: Patient in today reporting anxiety and increased depression.  Denies any SI.  Situation with husband has worsened some and she feels like she is not sure if they are really going to separate or not as she is getting lots of mixed messages.  Tearful today as she processes all of her thoughts and feelings about this situation.  Lots of anxious/depressive thoughts leading her to feel more depressed and anxious.  Still working on forgiveness of self, forgiveness of others, and wanting to "see a way forward".  Her religious believes are also impacting her and have always been an important part of her life.  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  Worked more with her today on her treatment goal plan strategies and short-term goal, working especially to interrupt her excessive anxious/depressive thoughts and replace them with more reality based and encouraging thoughts.  Encouraged patient to hold onto the hope she has, continue to work with her goals as we discussed each session, practice healthy nutrition and some exercise, staying in the present, focusing on what she can control or change versus cannot, practicing forgiveness of self and others as discussed in session, and staying in touch with people who are supportive of her.  Goal review and progress/challenges noted with patient.  Next appointment within 1-2 weeks.   Mathis Fare, LCSW

## 2020-07-26 ENCOUNTER — Ambulatory Visit (INDEPENDENT_AMBULATORY_CARE_PROVIDER_SITE_OTHER): Payer: No Typology Code available for payment source | Admitting: Psychiatry

## 2020-07-26 ENCOUNTER — Other Ambulatory Visit: Payer: Self-pay

## 2020-07-26 DIAGNOSIS — F411 Generalized anxiety disorder: Secondary | ICD-10-CM

## 2020-07-26 NOTE — Progress Notes (Signed)
[facility] Counselor/Therapist Progress Note  Patient ID: Jacqueline Porter, MRN: [id number],    Date: 07/26/2020  Time Spent: 60 minutes   4:00pm to 5:00pm  Treatment Type: Individual Therapy  Reported Symptoms: Anxiety, depression  Mental Status Exam:  Appearance:   Neat     Behavior:  Appropriate, Sharing and Motivated  Motor:  Normal  Speech/Language:   Clear and Coherent  Affect:  anxious  Mood:  anxious and depressed  Thought process:  goal directed  Thought content:    WNL  Sensory/Perceptual disturbances:    WNL  Orientation:  oriented to person, place, time/date, situation, day of week, month of year and year  Attention:  Good  Concentration:  Good and Fair  Memory:  WNL  Fund of knowledge:   Good  Insight:    Good  Judgment:   Good  Impulse Control:  Good   Risk Assessment: Danger to Self:  No Self-injurious Behavior: No Danger to Others: No Duty to Warn:no Physical Aggression / Violence:No  Access to Firearms a concern: No  Gang Involvement:No   Subjective: Patient in today reporting anxiety and some depression.   Interventions: Cognitive Behavioral Therapy and Solution-Oriented/Positive Psychology  Diagnosis:   ICD-10-CM   1. Generalized anxiety disorder  F41.1     Plan of Care: Patient not signing treatment plan on computer screen due to Covid.  Treatment Goals: Treatment Goals remain on treatment plan as patient works with strategies to achieve her goals. Progress is noted each session and documented in "Progress" section of Note.  Long term goal: Reduce overall level, frequency, and intensity of the anxiety so that daily functioning is not impaired.  Short-term goal: Verbalize verbalize an understanding of the role that anxious/fearful thinking plays in creating fears excessive worry and persistent anxiety symptoms.  Strategies: Identify, challenge, and replace anxious/fearful/negative self talk and thoughts with more positive,  reality based, and empowering thoughts and self talk.  Progress: Patient in today reporting anxiety and some depression. Continued processing her problems and emotions related to her marital situation on verge of ending. Anxiety increased more recently and finding communication in the marriage to be strained and difficult at times. She and husband are reading book together entitled "When Godly People do UnGodly Things." Some times and discussions can be so difficult and at other times "we do a little better."  Situation with husband is up and down but at times he seems interested in them working together on the marriage and at other times, not so much.  Is not interested in marital therapy.  Still having some periods of tearfulness and struggling with guilt which we discussed more today and reviewed strategies from prior sessions for her to continue working on.  Anxious/depressive thoughts still occur but are not quite as overwhelming as they were. Is feeling that going back to work has been a good decision for her as that puts her around people all day rather than being alone.  Still working with her treatment plan and reducing the overall level of her anxiety and working with the sometimes powerful anxious/negative/depressive thoughts as mentioned above.  Denies any SI.  Encouraged patient to keep on working on her personal issues as she has been doing, definitely hold onto hope, to practice forgiveness of herself and others as we processed in session previously and some today, continue to practice healthy nutrition and some exercise, stay in the present and focus on what she can control, stay in touch with  people who are supportive of her, and let her faith be a source of emotional support for her.  Goal review and progress/challenges noted with patient.  Next appointment within 2 to 3 weeks.  Mathis Fare, LCSW

## 2020-08-09 ENCOUNTER — Ambulatory Visit: Payer: No Typology Code available for payment source | Admitting: Psychiatry

## 2020-08-10 ENCOUNTER — Ambulatory Visit (INDEPENDENT_AMBULATORY_CARE_PROVIDER_SITE_OTHER): Payer: No Typology Code available for payment source | Admitting: Psychiatry

## 2020-08-10 ENCOUNTER — Other Ambulatory Visit: Payer: Self-pay

## 2020-08-10 DIAGNOSIS — F411 Generalized anxiety disorder: Secondary | ICD-10-CM | POA: Diagnosis not present

## 2020-08-10 NOTE — Progress Notes (Signed)
[facility] Counselor/Therapist Progress Note  Patient ID: Jacqueline Porter, MRN: [id number],    Date: 08/10/2020  Time Spent: 60 minutes    8:00am to 9:00am  Treatment Type: Individual Therapy  Reported Symptoms: anxiety, sadness, depression, less motivation, "emotional heaviness"  Mental Status Exam:  Appearance:   Well Groomed     Behavior:  Appropriate, Sharing and Motivated  Motor:  Normal  Speech/Language:   Clear and Coherent  Affect:  anxious, depressed  Mood:  anxious, depressed and sad  Thought process:  goal directed  Thought content:    WNL  Sensory/Perceptual disturbances:    WNL  Orientation:  oriented to person, place, time/date, situation, day of week, month of year and year  Attention:  Good  Concentration:  Fair  Memory:  WNL  Fund of knowledge:   Good  Insight:    Good/Fair  Judgment:   Good  Impulse Control:  Good   Risk Assessment: Danger to Self:  No Self-injurious Behavior: No Danger to Others: No Duty to Warn:no Physical Aggression / Violence:No  Access to Firearms a concern: No  Gang Involvement:No   Subjective: Patient today reports anxiety, sadness, confused about marriage situation, depression. Mixed feelings, she tearfully describes her marital situation.  Interventions: Cognitive Behavioral Therapy, Solution-Oriented/Positive Psychology and Ego-Supportive  Diagnosis:   ICD-10-CM   1. Generalized anxiety disorder  F41.1      Plan of Care: Patient not signing treatment plan on computer screen due to Covid.  Treatment Goals: Treatment Goals remain on treatment plan as patient works with strategies to achieve her goals. Progress is noted each session and documented in "Progress" section of Note.  Long term goal: Reduce overall level, frequency, and intensity of the anxiety so that daily functioning is not impaired.  Short-term goal: Verbalize verbalize an understanding of the role that anxious/fearful thinking plays in  creating fears excessive worry and persistent anxiety symptoms.  Strategies: Identify, challenge, and replace anxious/fearful/negative self talk and thoughts with more positive, reality based, and empowering thoughts and self talk.  Progress: Patient in today reporting anxiety, sadness, depression,"emotional heaviness", mixed feelings and confused about marital concerns, and tearfully processes her thoughts and feelings regarding her marriage and the difficulties involved. Denies any SI. Had some very stressful/hurtful situations with husband which she shared and discussed today. Husband doesn't seem to be consistent with his thoughts and feelings about whether patient will have to leave soon.  Feels she doesn't know what to expect and husband gives mixed messages. Wants for them to be able to work on the marriage even though husband has refused marital therapy.  Patient still wants to work on marriage and plans to speak with husband more about this.  Looks specifically at what she needs to do to take care of herself and 7th grade daughter as well.  Anxiety and sadness/depression had increased recently due to marital circumstances.  She and husband are still reading a book together entitled "when godly people do ungodly things" and husband still comments about how helpful the book is although has not indicated a willingness to work on marriage and still talks of separating.  Is glad that she is back at work and is liking her new [employer] and her job.  Patient's church family has been supportive and continues to be.  She still works with her treatment plan goals and reducing the overall level of her anxiety and working to better manage powerful anxious/negative/depressive thoughts as mentioned above.  Encouraged  her in her desire to hold onto hope, to practice more forgiveness of herself and others which we have discussed in sessions prior to today and again today, to focus on the things she can  control versus cannot, to stay in touch with people who are supportive of her, to let her faith be a source of strength for her as it has over the years, and to realize the strength she is showing as she continues to work on her goals.   Goal review and progress/challenges noted with patient.  Next appointment within 2 weeks.   Mathis Fare, LCSW

## 2020-08-23 ENCOUNTER — Ambulatory Visit: Payer: No Typology Code available for payment source | Admitting: Psychiatry

## 2020-08-27 ENCOUNTER — Other Ambulatory Visit: Payer: Self-pay | Admitting: Family Medicine

## 2020-08-27 DIAGNOSIS — Z1231 Encounter for screening mammogram for malignant neoplasm of breast: Secondary | ICD-10-CM

## 2020-09-02 ENCOUNTER — Other Ambulatory Visit: Payer: Self-pay

## 2020-09-02 ENCOUNTER — Ambulatory Visit (INDEPENDENT_AMBULATORY_CARE_PROVIDER_SITE_OTHER): Payer: No Typology Code available for payment source | Admitting: Psychiatry

## 2020-09-02 DIAGNOSIS — F411 Generalized anxiety disorder: Secondary | ICD-10-CM

## 2020-09-02 NOTE — Progress Notes (Signed)
[facility] Counselor/Therapist Progress Note  Patient ID: Jacqueline Porter, MRN: [id number],    Date: 09/02/2020  Time Spent: 60 minutes    5:00pm to 6:00pm  Treatment Type: Individual Therapy  Reported Symptoms: Anxiety (some decrease), depression, sadness  Mental Status Exam:  Appearance:   Neat     Behavior:  Appropriate, Sharing and Motivated  Motor:  Normal  Speech/Language:   Clear and Coherent  Affect:  anxious, depressed, sad  Mood:  anxious, depressed and sad  Thought process:  goal directed  Thought content:    WNL  Sensory/Perceptual disturbances:    WNL  Orientation:  oriented to person, place, time/date, situation, day of week, month of year and year  Attention:  Good  Concentration:  Good and Fair  Memory:  WNL  Fund of knowledge:   Good  Insight:    Good  Judgment:   Good and Fair  Impulse Control:  Good   Risk Assessment: Danger to Self:  No Self-injurious Behavior: No Danger to Others: No Duty to Warn:no Physical Aggression / Violence:No  Access to Firearms a concern: No  Gang Involvement:No   Subjective: Patient today reporting anxiety, depression, sadness.  (See Progress Note below.)  Interventions: Cognitive Behavioral Therapy, Solution-Oriented/Positive Psychology and Ego-Supportive  Diagnosis:   ICD-10-CM   1. Generalized anxiety disorder  F41.1      Plan of Care: Patient not signing treatment plan on computer screen due to Covid.  Treatment Goals: Treatment Goals remain on treatment plan as patient works with strategies to achieve her goals. Progress is noted each session and documented in "Progress" section of Note.  Long term goal: Reduce overall level, frequency, and intensity of the anxiety so that daily functioning is not impaired.  Short-term goal: Verbalize verbalize an understanding of the role that anxious/fearful thinking plays in creating fears excessive worry and persistent anxiety  symptoms.  Strategies: Identify, challenge, and replace anxious/fearful/negative self talk and thoughts with more positive, reality based, and empowering thoughts and self talk.  Progress: Patient in today reporting anxiety, sadness, depression.  Denies any SI.  Still living with husband but he is still wanting to end their marriage. Patient hoping to get regular job and her current job ends in June. Husband wants her to leave home but is letting her stay til she finds place to move to.  Getting inconsistent messages from husband and so far has been unwilling to try to work on their marriage. Patient continues to work on self-forgiveness, trying to stop self-negating, and trying to gain more in her strength as she works with treatment goal plan to reduce her symptoms and feel like she can move forward, based on her strength and not on husband's decisions.  Dealt with a lot of internal conflict and uncertainties today and seemed to be at a more grounded place by session end.  To work more in between sessions on interrupting/replacing her self negating and anxious thoughts and self talk to be more reality- based and positive thinking and self talk.  Her church family continues to be emotionally supportive of her, which feels good to her but can also stir up some mixed feelings due to all the circumstances involved in her situation.  Encouraged patient to continue practicing more forgiveness of herself and others as discussed in session, to stay in the present and focus on what she can control, to maintain contact with people who are supportive of her, to hold onto hope, to let her  faith be a source of strength for her, and to practice more self understanding and realize the strength she is showing in the midst of very difficult circumstances.  Goal review and progress/challenges noted with patient.  Next appointment within 2 to 3 weeks.   Mathis Fare, LCSW

## 2020-09-06 ENCOUNTER — Ambulatory Visit: Payer: No Typology Code available for payment source | Admitting: Psychiatry

## 2020-09-14 ENCOUNTER — Ambulatory Visit: Payer: No Typology Code available for payment source | Admitting: Psychiatry

## 2020-09-20 ENCOUNTER — Other Ambulatory Visit: Payer: Self-pay

## 2020-09-20 ENCOUNTER — Ambulatory Visit
Admission: RE | Admit: 2020-09-20 | Discharge: 2020-09-20 | Disposition: A | Discharge status: Home / Self Care | Payer: No Typology Code available for payment source | Source: Ambulatory Visit | Attending: Family Medicine | Admitting: Family Medicine

## 2020-09-20 DIAGNOSIS — Z1231 Encounter for screening mammogram for malignant neoplasm of breast: Secondary | ICD-10-CM | POA: Insufficient documentation

## 2020-09-21 ENCOUNTER — Ambulatory Visit (INDEPENDENT_AMBULATORY_CARE_PROVIDER_SITE_OTHER): Payer: No Typology Code available for payment source | Admitting: Psychiatry

## 2020-09-21 DIAGNOSIS — F411 Generalized anxiety disorder: Secondary | ICD-10-CM

## 2020-09-21 NOTE — Progress Notes (Signed)
[facility] Counselor/Therapist Progress Note  Patient ID: Jacqueline Porter, MRN: [id number],    Date: 09/21/2020  Time Spent: 60 minutes    11:00am to 12:00noon  Treatment Type: Individual Therapy  Reported Symptoms: anxiety, depression, sadness (regarding personal and marital situation)  Mental Status Exam:  Appearance:   Neat     Behavior:  Appropriate, Sharing and Motivated  Motor:  Normal  Speech/Language:   Clear and Coherent  Affect:  Tearful  Mood:  anxiety, depression  Thought process:  goal directed  Thought content:    WNL  Sensory/Perceptual disturbances:    WNL  Orientation:  oriented to person, place, time/date, situation, day of week, month of year and year  Attention:  Fair  Concentration:  Fair  Memory:  WNL  Fund of knowledge:   Good  Insight:    Good and Fair  Judgment:   Good and Fair  Impulse Control:  Fair   Risk Assessment: Danger to Self:  No Self-injurious Behavior: No Danger to Others: No Duty to Warn:no Physical Aggression / Violence:No  Access to Firearms a concern: No  Gang Involvement:No   Subjective: Patient today reporting anxiety, depression, sadness re: personal, marital issues.  (See Progress Note below.)  Interventions: Cognitive Behavioral Therapy and Solution-Oriented/Positive Psychology  Diagnosis:   ICD-10-CM   1. Generalized anxiety disorder  F41.1      Plan of Care: Patient not signing treatment plan on computer screen due to Covid.  Treatment Goals: Treatment Goals remain on treatment plan as patient works with strategies to achieve her goals. Progress is noted each session and documented in "Progress" section of Note.  Long term goal: Reduce overall level, frequency, and intensity of the anxiety so that daily functioning is not impaired.  Short-term goal: Verbalize verbalize an understanding of the role that anxious/fearful thinking plays in creating fears excessive worry and persistent anxiety  symptoms.  Strategies: Identify, challenge, and replace anxious/fearful/negative self talk and thoughts with more positive, reality based, and empowering thoughts and self talk.  Progress: Patient in today reporting anxiety, sadness, and depression mostly regarding personal and marital concerns. Shares that husband has told her she needs to "pack up my things by [place] break." Denies any SI. States he's not willing to talk about any hopes for their marriage, but also seems to give her mixed messages re: relationship. Processed her concerns about marriage and their current issues, as well as her frustration that husband doesn't seem willing to work on marriage with patient, and yet at times will make comments as if they are not operating.  Discussed her feelings of wanting to work on marriage with husband and husband saying he does not, and will frequently mention about them eventually ending their marriage.  Patient shared and processed more recent examples of mixed messages from her husband which seemed to be very helpful to patient today.  She is continuing to look for a regular job and has an interview coming up next week.  Encouraged patient to continue her work on self-forgiveness, letting go of self-negating, and working to gain more strength as she focuses on treatment goals and trying to reduce her symptoms and believe in herself that she can move forward.  Patient to continue work between sessions of interrupting/replacing negative/anxious self-talk to be more realistic and intentionally looking for more positives.  Also encouraged patient to work on staying in the present and focus to stay in touch with people who are supportive of her, to  let her faith be a source of strength for her, to hold onto hope and believe in herself, practice positive self talk and increased self-understanding, and feel good about the strength that she is showing during difficult circumstances as she tries to move  forward in a more positive direction.   Goal review and progress/challenges noted with patient.  Next appointment within 2 to 3 weeks.   Mathis Fare, LCSW

## 2020-10-05 ENCOUNTER — Ambulatory Visit (INDEPENDENT_AMBULATORY_CARE_PROVIDER_SITE_OTHER): Payer: No Typology Code available for payment source | Admitting: Psychiatry

## 2020-10-05 ENCOUNTER — Other Ambulatory Visit: Payer: Self-pay

## 2020-10-05 DIAGNOSIS — F411 Generalized anxiety disorder: Secondary | ICD-10-CM | POA: Diagnosis not present

## 2020-10-05 NOTE — Progress Notes (Signed)
[facility] Counselor/Therapist Progress Note  Patient ID: ELISKA HAMIL, MRN: [id number],    Date: 10/05/2020  Time Spent: 60 minutes   11:00am to 12:00noon  Treatment Type: Individual Therapy  Reported Symptoms: anxiety, depression  Mental Status Exam:  Appearance:   Neat     Behavior:  Appropriate, Sharing and Motivated  Motor:  Normal  Speech/Language:   Clear and Coherent  Affect:  anxious, depression  Mood:  anxious, depressed and sad  Thought process:  goal directed  Thought content:    WNL  Sensory/Perceptual disturbances:    WNL  Orientation:  oriented to person, place, time/date, situation, day of week, month of year and year  Attention:  Good  Concentration:  Good and Fair  Memory:  WNL  Fund of knowledge:   Good  Insight:    Good and Fair  Judgment:   Good and Fair  Impulse Control:  Good   Risk Assessment: Danger to Self:  No Self-injurious Behavior: No Danger to Others: No Duty to Warn:no Physical Aggression / Violence:No  Access to Firearms a concern: No  Gang Involvement:No   Subjective: Patient today reporting daily anxiety and depression, still mostly related to personal and marital situation.  See progress note below.  Interventions: Solution-Oriented/Positive Psychology and Ego-Supportive  Diagnosis:   ICD-10-CM   1. Generalized anxiety disorder  F41.1      Plan of Care: Patient not signing treatment plan on computer screen due to Covid.  Treatment Goals: Treatment Goals remain on treatment plan as patient works with strategies to achieve her goals. Progress is noted each session and documented in "Progress" section of Note.  Long term goal: Reduce overall level, frequency, and intensity of the anxiety so that daily functioning is not impaired.  Short-term goal: Verbalize verbalize an understanding of the role that anxious/fearful thinking plays in creating fears excessive worry and persistent anxiety  symptoms.  Strategies: Identify, challenge, and replace anxious/fearful/negative self talk and thoughts with more positive, reality based, and empowering thoughts and self talk.  Progress: Patient in today reporting daily anxiety and depression with anxiety being the strongest.  Had shoulder and chest pains recently, saw cardiologist and they're thinking it is related to pinched nerve. Increased patient's anxiety. States the situation at home is "up and down"and husband's words and behavior are unpredictable. Communication is difficult and we discussed some of the communication problems happening currently with husband. Worked specifically on active listening skills to really hear what other person is saying versus listening only to respond. Also focused on what could be some best responses for patient when comments are made to patient that are hurtful.  Still having some anxious/fearful thoughts and we used her short-term goal and strategies and treatment plan above to work with these in session today.  Encouraged patient to continue letting go of self-negating, to work on self forgiveness, and seems to be gaining some more strength as she uses treatment goals and trying to reduce symptoms and believe in herself more.  She is to continue working with strategies in between sessions and interrupting/replacing anxious/negative thoughts and self talk to be more realistic, looking intentionally for positives every day, stay in the present and focus on what she can change, remain in contact with supportive people, to use her faith as a resource of strength for her, practice increased self understanding, hold onto hope and believe in herself, and to recognize the strength that she is showing as she works on treatment goals  in order to move forward in the midst of difficult circumstances.   Goal review and progress/challenges noted with patient.  Next appointment within 2 weeks.   Mathis Fare,  LCSW

## 2020-10-12 ENCOUNTER — Ambulatory Visit: Payer: No Typology Code available for payment source | Admitting: Psychiatry

## 2020-10-27 ENCOUNTER — Other Ambulatory Visit: Payer: Self-pay

## 2020-10-27 ENCOUNTER — Ambulatory Visit (INDEPENDENT_AMBULATORY_CARE_PROVIDER_SITE_OTHER): Payer: No Typology Code available for payment source | Admitting: Psychiatry

## 2020-10-27 DIAGNOSIS — F411 Generalized anxiety disorder: Secondary | ICD-10-CM

## 2020-10-27 NOTE — Progress Notes (Signed)
[facility] Counselor/Therapist Progress Note  Patient ID: Jacqueline Porter, MRN: [id number],    Date: 10/27/2020  Time Spent: 60 minutes       5:00pm to 6:00pm  Treatment Type: Individual Therapy  Reported Symptoms: anxiety, depression  Mental Status Exam:  Appearance:   Casual and Neat     Behavior:  Appropriate  Motor:  Normal  Speech/Language:   Clear and Coherent  Affect:  anxious, depressed  Mood:  anxious and depressed  Thought process:  goal directed  Thought content:    WNL  Sensory/Perceptual disturbances:    WNL  Orientation:  oriented to person, place, time/date, situation, day of week, month of year and year  Attention:  Fair  Concentration:  Fair and Poor  Memory:  WNL  Fund of knowledge:   Good  Insight:    Good and Fair  Judgment:   Good and Fair  Impulse Control:  Fair and Poor   Risk Assessment: Danger to Self:  No Self-injurious Behavior: No Danger to Others: No Duty to Warn:no Physical Aggression / Violence:No  Access to Firearms a concern: No  Gang Involvement:No   Subjective:   Patient today working on current issues within marriage and family and processing lots of thoughts and feelings including depression, anxiety, fears, sadness.  Significant communication and trust issues within marital relationship. Hard to sense stability in midst of difficult conversations with husband.  Patient did well today in working through a lot of hurt, uncertainties, doubts about herself, and concerns for her daughter in the midst of turmoil at home.  She feels that there is some inconsistent communication happening and she is not sure what to trust.  Used some CBT with patient to help better understand how her thoughts are leading directly to what she is feeling and in order to change her feelings, we need to continue working with changing some thoughts as discussed in the earlier part of session.  Seemed to really appreciate talking through her fears and anxieties  today and was definitely more grounded by the end of the session.  Anxiety does seem to have lessened some.  Strongly encouraged increased positive self-care and self talk.  As well as other recommendations below in "progress/plan section" of this note.   Interventions: Solution-Oriented/Positive Psychology, Ego-Supportive and Insight-Oriented  Diagnosis:   ICD-10-CM   1. Generalized anxiety disorder  F41.1     Treatment Goal Plan of Care:  Patient not signing treatment plan on computer screen due to Covid. Treatment Goals: Treatment Goals remain on treatment plan as patient works with strategies to achieve her goals. Progress is noted each session and documented in "Progress" section of Note. Long term goal: Reduce overall level, frequency, and intensity of the anxiety so that daily functioning is not impaired. Short-term goal: Verbalize verbalize an understanding of the role that anxious/fearful thinking plays in creating fears excessive worry and persistent anxiety symptoms. Strategies: Identify, challenge, and replace anxious/fearful/negative self talk and thoughts with more positive, reality based, and empowering thoughts and self talk.    Progress/Plan: Patient in today motivated and worked on issues related to marriage and family that have been very hurtful, sad, and exhausting for patient. She is progressing some gradually with some ups and downs, and continues to work hard confronting difficult circumstances. Some pastoral care support from minister received.  Encouraged patient to work with her goal-directed behaviors outside of therapy sessions as well especially regarding her anxious/fearful thoughts.  Also encouraged her to  stop self negating, to work on self forgiveness, to look intentionally for positives every day, stay in the present focused on what she can change, to be more realistic in her self talk and expectations of herself, stay in contact with supportive people, to  practice more seeing the positives in herself, to hold onto hope and believe in herself, to let her faith be a resource in her emotional health as well as spiritual, practice more self understanding, and to feel good about the strength that she shows and working on goal-directed behaviors in the midst of challenging circumstances as she tries to move forward in a more positive direction.   Goal review and progress/challenges noted with patient.  Next appointment within 2 to 3 weeks.   Mathis Fare, LCSW

## 2020-11-10 ENCOUNTER — Ambulatory Visit (INDEPENDENT_AMBULATORY_CARE_PROVIDER_SITE_OTHER): Payer: No Typology Code available for payment source | Admitting: Psychiatry

## 2020-11-10 ENCOUNTER — Other Ambulatory Visit: Payer: Self-pay

## 2020-11-10 DIAGNOSIS — F411 Generalized anxiety disorder: Secondary | ICD-10-CM

## 2020-11-10 NOTE — Progress Notes (Signed)
[facility] Counselor/Therapist Progress Note  Patient ID: Jacqueline Porter, MRN: [id number],    Date: 11/10/2020  Time Spent: 60 minutes  Treatment Type: Individual Therapy  Reported Symptoms: anxiety, depression, to move out from husband June 15  Mental Status Exam:  Appearance:   Neat     Behavior:  Appropriate, Sharing and Motivated  Motor:  Normal  Speech/Language:   Clear and Coherent  Affect:  anxious, depressed  Mood:  anxious and depressed  Thought process:  goal directed  Thought content:    Abstract Reasoning/ Overthinking  Sensory/Perceptual disturbances:    WNL  Orientation:  oriented to person, place, time/date, situation, day of week, month of year and year  Attention:  Fair  Concentration:  Fair  Memory:  WNL  Fund of knowledge:   Good  Insight:    Fair  Judgment:   Good and Fair  Impulse Control:  Good   Risk Assessment: Danger to Self:  No Self-injurious Behavior: No Danger to Others: No Duty to Warn:no Physical Aggression / Violence:No  Access to Firearms a concern: No  Gang Involvement:No   Subjective:  Patient in today reporting anxiety as stronger symptom along with depression. On a 1-10 scale for anxiety she rates herself a "7".  On 1-10 depression scale, she rates herself a "5" today.Husband brought home the separation papers recently and they reviewed them. To separate June 15th.  Husband is still helping her some, but not consistently, and unstable mood. Processed more of her thoughts and feelings about her pending separation from husband, and patient is showing more strength, belief in herself, and is looking at several different job options recently and in the next few weeks. Working on self-forgiveness on some issues in order to move forward. Some increase in her self-esteem which is a work in progress. Fears and sadness has decreased some.  Feels some difficulty feeling stable in the midst of inconsistent conversations with husband but patient  is actually showing more stability and in moving forward even though there are still a lot of uncertainties ahead of her.  Continues to work through hurts but is not letting them deter her in  feeling more self reliant and capable.  Worked with some CBT and insight oriented therapy to help patient better understand some of her thoughts that lead to specific feelings she is having regarding herself and all the changes she is going through.  Did a good job in talking through multiple issues and was definitely more grounded by the end of session. ( See progress/plan section below.)   Interventions: Cognitive Behavioral Therapy, Solution-Oriented/Positive Psychology and Insight-Oriented  Diagnosis:   ICD-10-CM   1. Generalized anxiety disorder  F41.1       Treatment Goal Plan:  Patient not signing treatment plan on computer screen due to Covid. Treatment Goals: Treatment Goals remain on treatment plan as patient works with strategies to achieve her goals. Progress is noted each session and documented in "Progress" section of Note. Long term goal: Reduce overall level, frequency, and intensity of the anxiety so that daily functioning is not impaired. Short-term goal: Verbalize verbalize an understanding of the role that anxious/fearful thinking plays in creating fears excessive worry and persistent anxiety symptoms. Strategies: Identify, challenge, and replace anxious/fearful/negative self talk and thoughts with more positive, reality based, and empowering thoughts and self talk.    Progress / Plan: Patient today quite motivated and worked well in session on her treatment goal plan and specific issues  noted in "Subjective" part above.  She continues to progress gradually with not everything going as planned but is able to manage those things as well as some other ups and downs and is still showing more confidence and determination to make good choices for herself and her daughter.  Encouraged  patient to work with her goal-directed behaviors out side of therapy sessions, to work more on self forgiveness, stop self negating, be more realistic in her self talk and expectations of herself, intentionally look for positives each day, get outside some on a daily basis, stay in the present focusing on what she can change, remain in contact with supportive people, practice seeing more positives within herself, let her faith be a resource in her improving emotional health as well as spiritual, hold onto her hope and belief in herself, and recognize the strength that she shows as she continues work on goal-directed behaviors and trying to move forward in a more positive direction even in the midst of changing and challenging circumstances.  Goal review and progress/challenges noted with patient.  Next appointment within 2 to 3 weeks.   Mathis Fare, LCSW

## 2020-11-22 ENCOUNTER — Ambulatory Visit: Payer: No Typology Code available for payment source | Admitting: Psychiatry

## 2020-12-06 ENCOUNTER — Other Ambulatory Visit: Payer: Self-pay

## 2020-12-06 ENCOUNTER — Ambulatory Visit (INDEPENDENT_AMBULATORY_CARE_PROVIDER_SITE_OTHER): Payer: No Typology Code available for payment source | Admitting: Psychiatry

## 2020-12-06 DIAGNOSIS — F411 Generalized anxiety disorder: Secondary | ICD-10-CM

## 2020-12-06 NOTE — Progress Notes (Signed)
[facility] Counselor/Therapist Progress Note  Patient ID: Jacqueline Porter, MRN: [id number],    Date: 12/06/2020  Time Spent: 60 minutes   Treatment Type: Individual Therapy  Reported Symptoms: anxiety, depression (improved)  Mental Status Exam:  Appearance:   Casual and Neat     Behavior:  Appropriate, Sharing, and Motivated  Motor:  Normal  Speech/Language:   Clear and Coherent  Affect:  Anxious, some depression  Mood:  anxious and depressed  Thought process:  goal directed  Thought content:    WNL  Sensory/Perceptual disturbances:    WNL  Orientation:  oriented to person, place, time/date, situation, day of week, month of year, year, and stated date of December 06, 2020  Attention:  Good  Concentration:  Good  Memory:  WNL  Fund of knowledge:   Good  Insight:    Good  Judgment:   Good  Impulse Control:  Good   Risk Assessment: Danger to Self:  No Self-injurious Behavior: No Danger to Others: No Duty to Warn:no Physical Aggression / Violence:No  Access to Firearms a concern: No  Gang Involvement:No   Subjective:  Patient in today stating that she and husband signed separation papers and patient and her daughter have moved out to their own place although husband has maintained contact and he has actually been more positive per patient. Not sure what she is feeling right now re: marriage.  "I still wear my wedding band and don't want to give up, I do think I'm healing in some ways, but still feel like I don't know how things are going to go. She and husband are to meet with minister again tomorrow night. Processed some grief issues today re: a friend who is dying in Hospice unit. Shares she went by and visited friend and sang for her while there, and to also sing at her funeral this week. Is feeling stronger in some ways in spite of several sad things going on in her life. Self reports today as a " 3/4 "  on 1-10 scale for depression and also a " 6  " on a 1-10 scale for  anxiety.  Self-forgiveness is a "work in progress" as patient is working to move forward. Sadness and fear has continued to decrease some. Is feeling more stable as conversations and interactions with separated husband are more grounded but also realizes they have only been separated a short period of time physically.  Also understands there are a lot of uncertainties ahead for her and not just in her marriage/separation.  Reviewed some of her work from last session with CBT and insight oriented therapy helping her better understand how some of her thoughts lead to specific feelings she is having regarding herself and the multiple changes that she is going through and the many different feelings she finds herself having.    Interventions: Cognitive Behavioral Therapy and Solution-Oriented/Positive Psychology  Diagnosis:   ICD-10-CM   1. Generalized anxiety disorder  F41.1       Plan:  Patient today showing good motivation in session today.  Is progressing individually and also with the way she is interacting with separated husband as they are communicating some even though they are separated.  Patient trying to adjust better when things do not go as planned or with unexpected ups and downs.  Encouraged her to continue work on her goal-directed behaviors outside of therapy sessions, to work on self forgiveness, stop self negating, stay in the present focusing on  what she can change, be more realistic and positive in her self talk and expectations of herself, get outside daily, intentionally look for more positives daily, That her faith be a resource in her emotional health as well as spiritual, remain in contact with supportive people, look for the positives within herself daily, hold onto her sense of hope and belief in herself, and to feel good about the strength that she shows as she continues working with goal-directed behaviors in the midst of challenging and changing circumstances and trying to  move forward in a more positive direction leading to improved emotional health.  Goal review and progress/challenges noted with patient.  Next appt within 3 weeks.   Mathis Fare, LCSW

## 2020-12-20 ENCOUNTER — Ambulatory Visit: Payer: No Typology Code available for payment source | Admitting: Psychiatry

## 2021-01-03 ENCOUNTER — Ambulatory Visit: Payer: No Typology Code available for payment source | Admitting: Psychiatry

## 2021-01-10 ENCOUNTER — Ambulatory Visit: Payer: No Typology Code available for payment source | Admitting: Psychiatry

## 2021-01-27 ENCOUNTER — Ambulatory Visit: Payer: No Typology Code available for payment source | Admitting: Psychiatry

## 2021-02-07 ENCOUNTER — Ambulatory Visit: Payer: No Typology Code available for payment source | Admitting: Psychiatry

## 2021-10-05 IMAGING — MG MM DIGITAL SCREENING BILAT W/ TOMO AND CAD
6 of 10 series · 6 of 30 positions shown · non-contrast
Comparison: Previous exam(s).

CLINICAL DATA: Screening.

EXAM:
DIGITAL SCREENING BILATERAL MAMMOGRAM WITH TOMOSYNTHESIS AND CAD
TECHNIQUE: Bilateral screening digital craniocaudal and mediolateral oblique
mammograms were obtained. Bilateral screening digital breast
tomosynthesis was performed. The images were evaluated with
computer-aided detection.

[L MLO synth-2D]
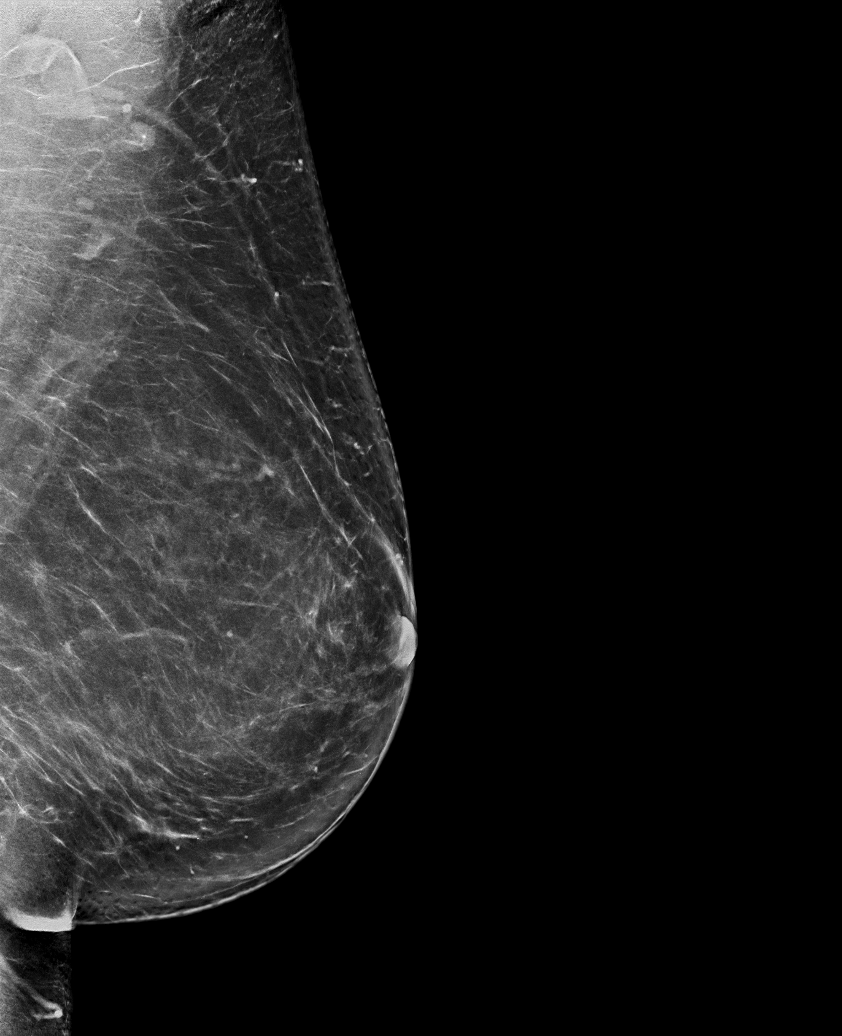

[R CC synth-2D]
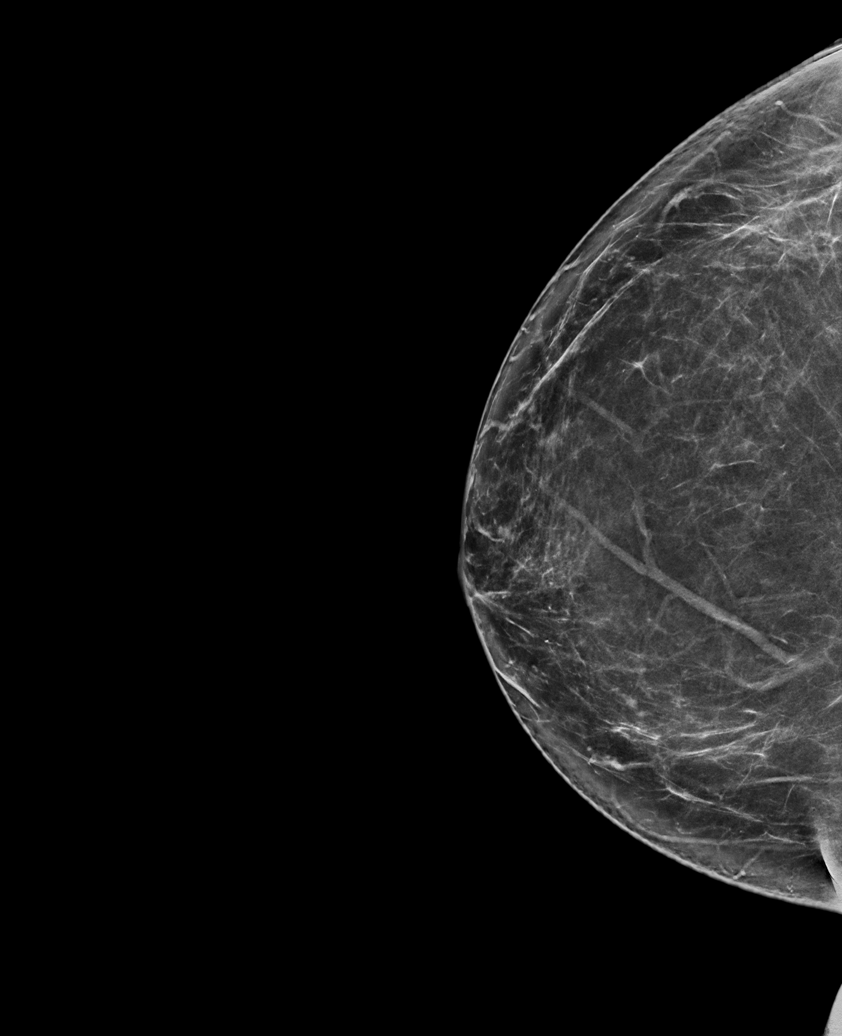

[R MLO synth-2D (1 of 2)]
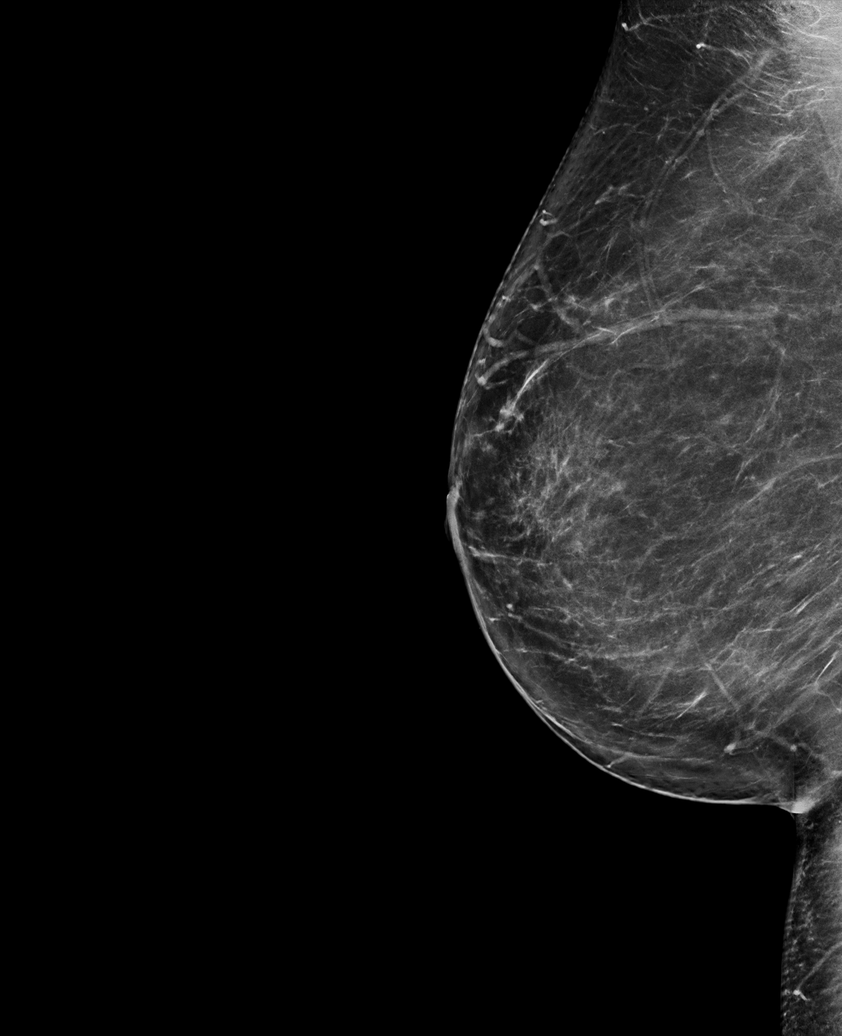

[L CC synth-2D]
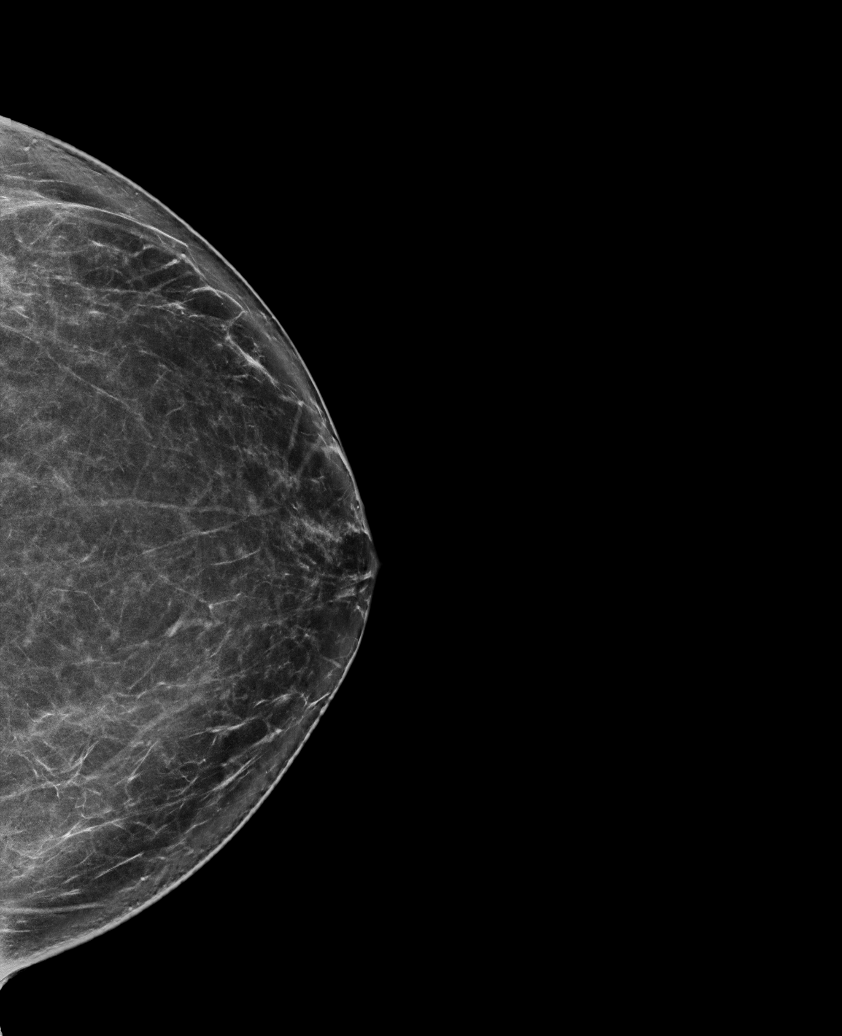

[R MLO synth-2D (2 of 2)]
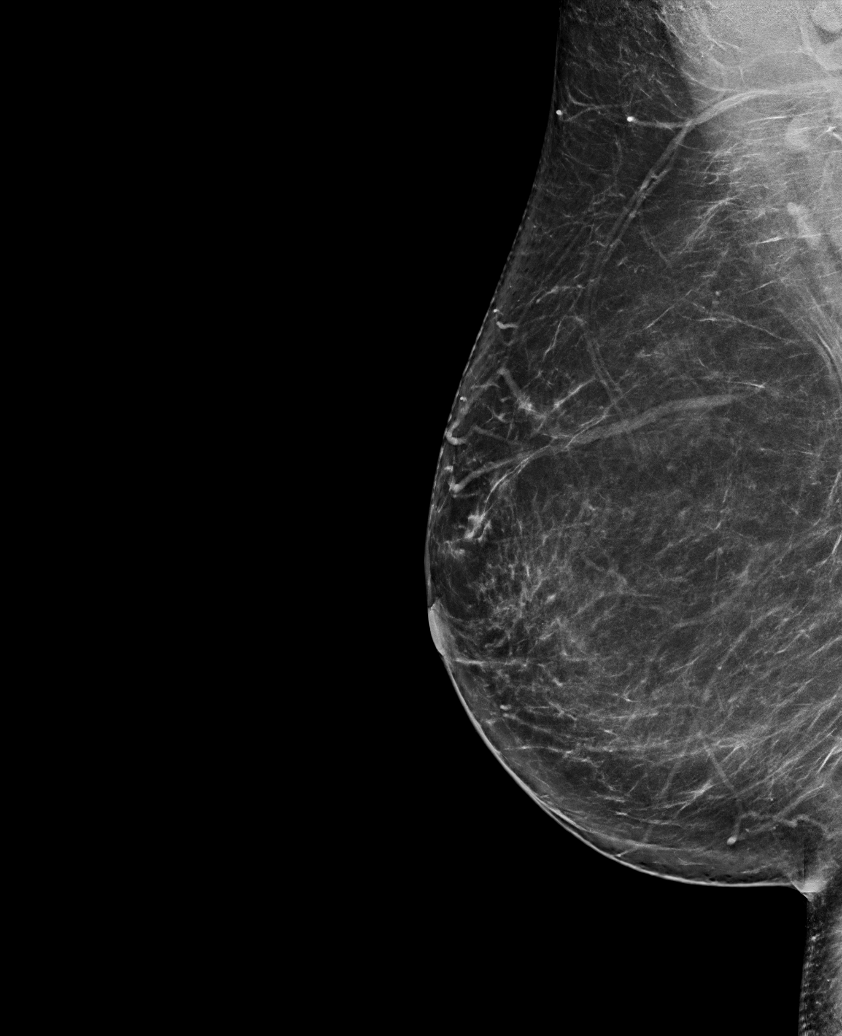

[R CC tomo · tomo slice 44/87.0]
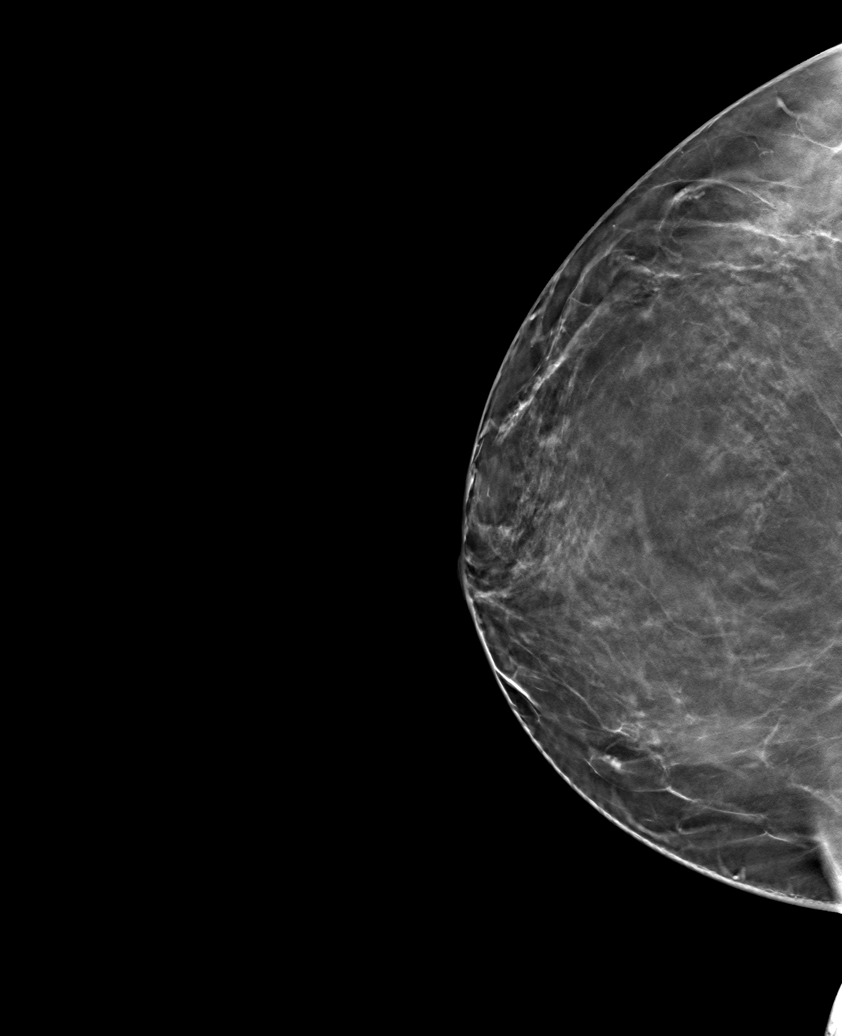

[6 of 30 positions shown; findings below may reference images not displayed]

ACR Breast Density Category b: There are scattered areas of
fibroglandular density.
FINDINGS: There are no findings suspicious for malignancy. The images were
evaluated with computer-aided detection.
IMPRESSION: No mammographic evidence of malignancy. A result letter of this
screening mammogram will be mailed directly to the patient.

RECOMMENDATION:
Screening mammogram in one year. (Code:[id number])

BI-RADS CATEGORY  1: Negative.

## 2023-01-05 ENCOUNTER — Other Ambulatory Visit: Payer: Self-pay | Admitting: Family Medicine

## 2023-01-05 DIAGNOSIS — Z1231 Encounter for screening mammogram for malignant neoplasm of breast: Secondary | ICD-10-CM

## 2023-01-12 ENCOUNTER — Ambulatory Visit
Admission: RE | Admit: 2023-01-12 | Discharge: 2023-01-12 | Disposition: A | Discharge status: Home / Self Care | Payer: Self-pay | Source: Ambulatory Visit | Attending: Family Medicine | Admitting: Family Medicine

## 2023-01-12 DIAGNOSIS — Z1231 Encounter for screening mammogram for malignant neoplasm of breast: Secondary | ICD-10-CM | POA: Insufficient documentation
